# Patient Record
Sex: Female | Born: 1980 | Race: White | Hispanic: No | Marital: Single | Smoking: Former smoker
Health system: Southern US, Community
[De-identification: ages and names within clinical notes are randomized; demographics above are authoritative.]

## PROBLEM LIST (undated history)

## (undated) DIAGNOSIS — F419 Anxiety disorder, unspecified: Secondary | ICD-10-CM

## (undated) HISTORY — PX: ANKLE SURGERY: SHX546

---
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
## 2004-02-22 ENCOUNTER — Emergency Department: Payer: Self-pay | Admitting: Emergency Medicine

## 2004-11-26 ENCOUNTER — Emergency Department: Payer: Self-pay | Admitting: General Practice

## 2004-11-27 ENCOUNTER — Ambulatory Visit: Payer: Self-pay | Admitting: General Practice

## 2004-11-29 ENCOUNTER — Ambulatory Visit: Payer: Self-pay | Admitting: Obstetrics and Gynecology

## 2005-06-01 ENCOUNTER — Emergency Department: Payer: Self-pay | Admitting: Emergency Medicine

## 2007-04-16 IMAGING — US US OB US >=[ID] SNGL FETUS
1 series · 14 of 28 positions shown · non-contrast
Comparison: none

REASON FOR EXAM: Positive Bleeding Beta 96111
COMMENTS:

[Series 1: us ob us >=(id) sngl fetus · 0.39mm/px · 14 of 39 slices shown]
[im 2/39]
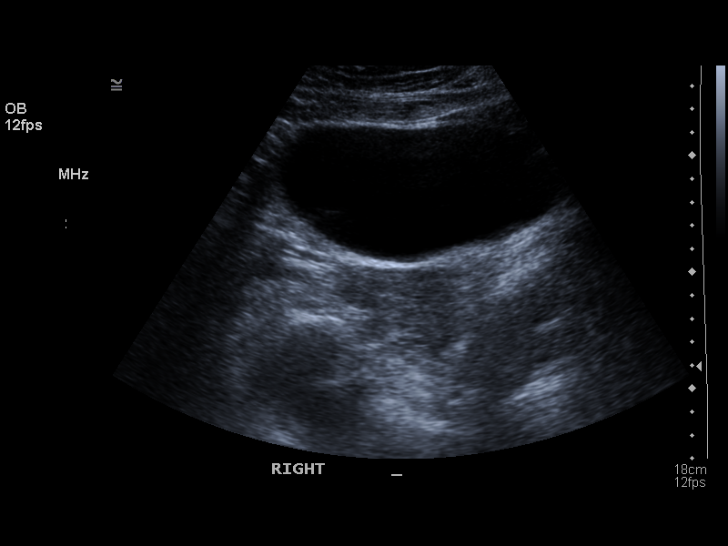
[im 5/39]
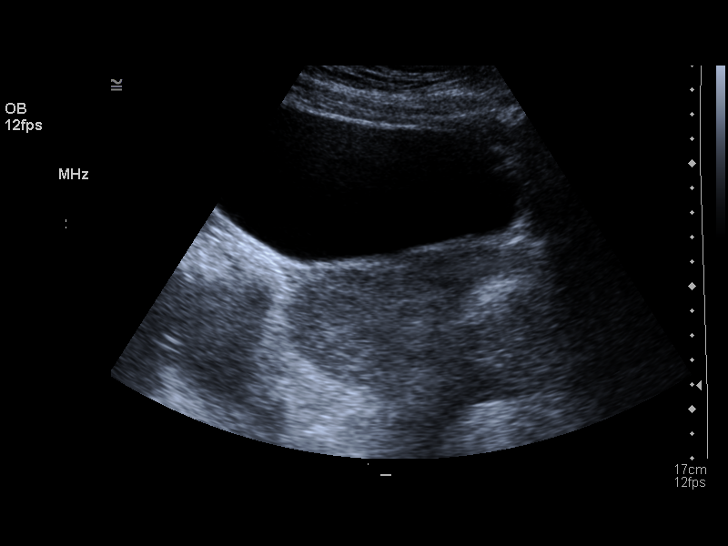
[im 8/39]
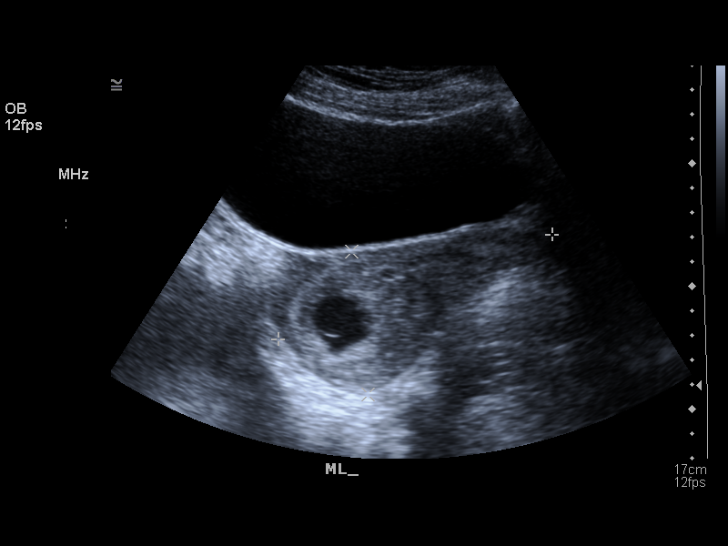
[im 10/39]
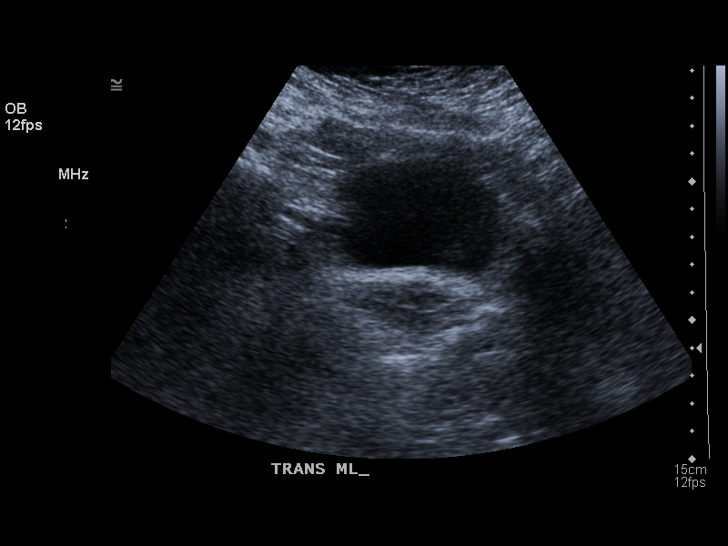
[im 13/39]
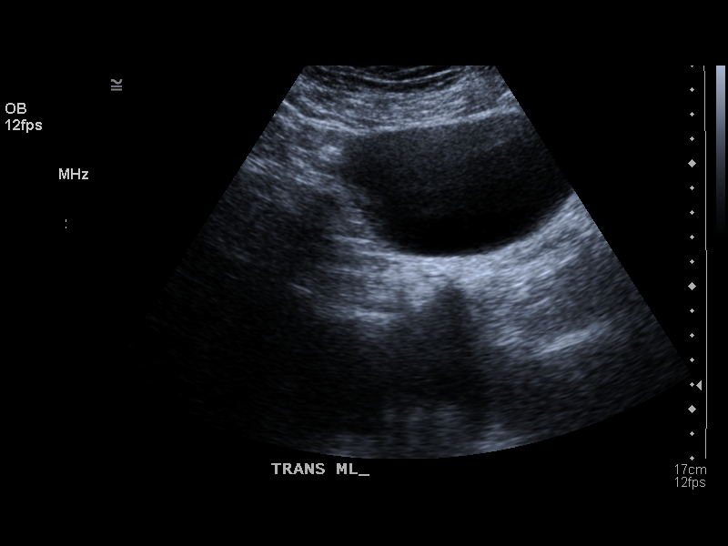
[im 16/39]
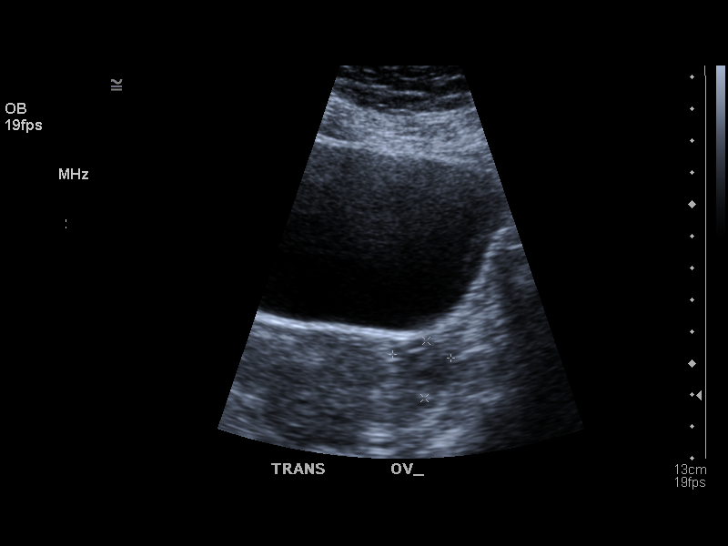
[im 19/39]
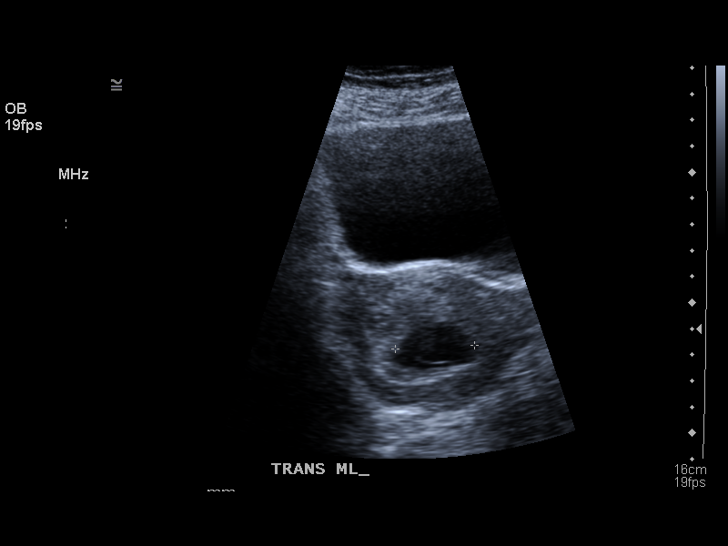
[im 22/39]
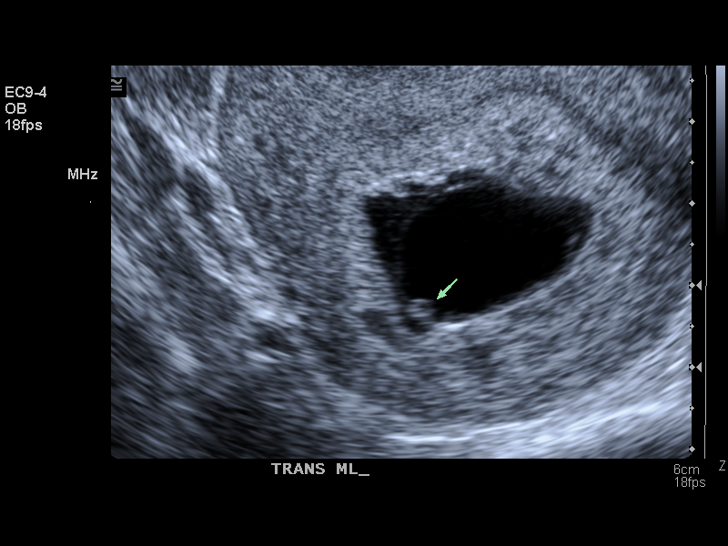
[im 24/39]
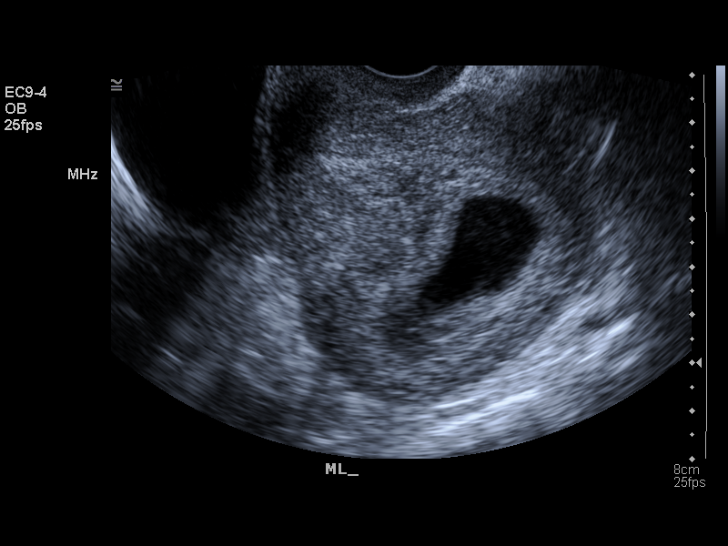
[im 27/39]
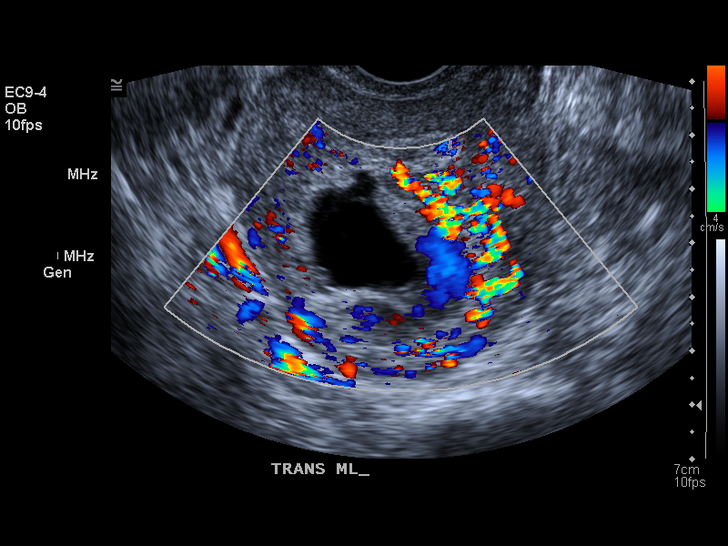
[im 30/39]
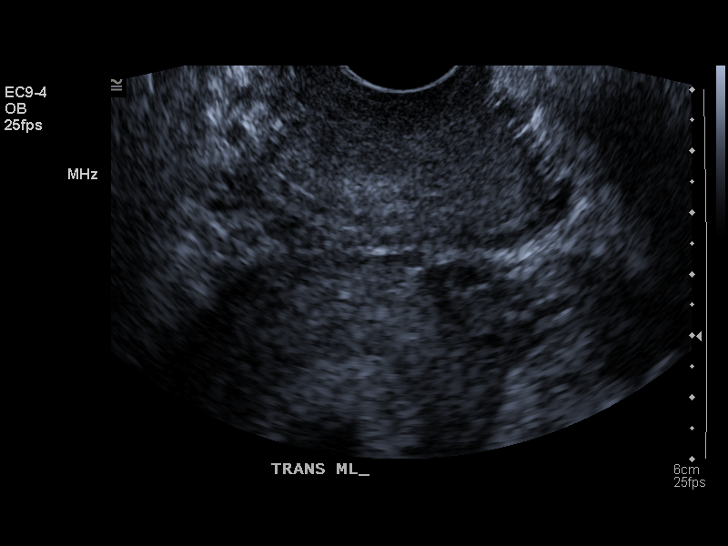
[im 33/39]
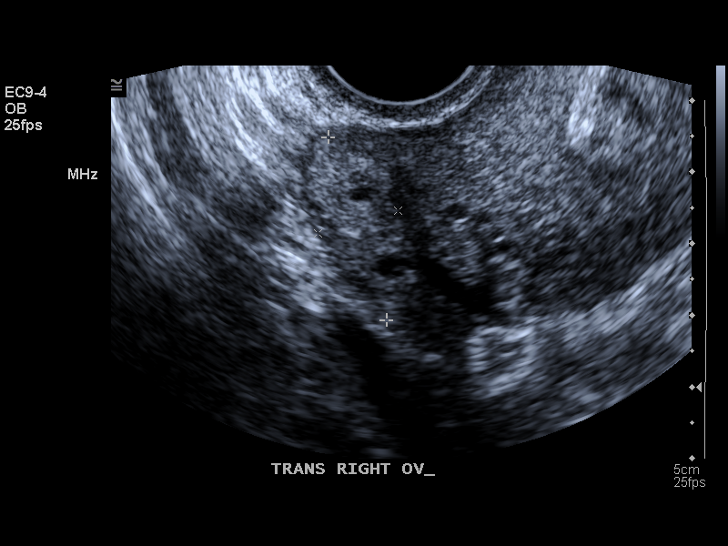
[im 36/39]
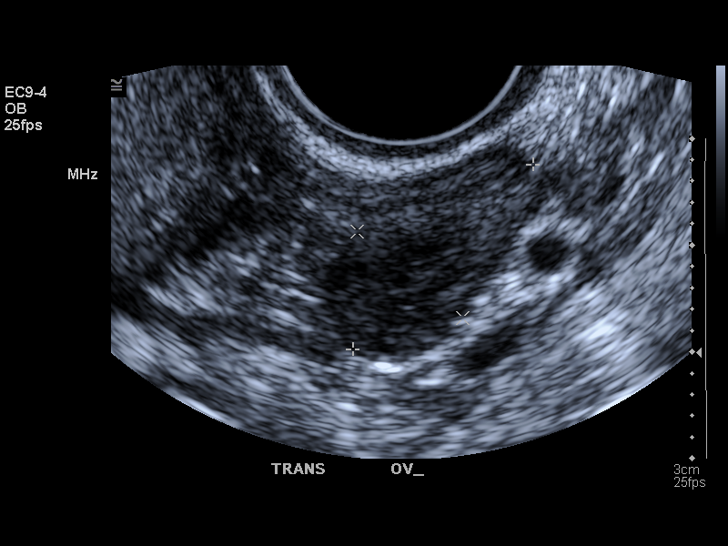
[im 39/39]
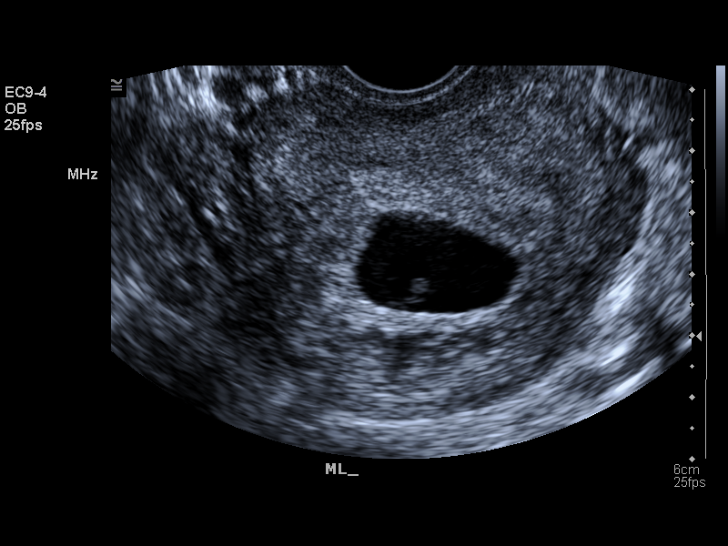

[14 of 28 positions shown; findings below may reference images not displayed]

PROCEDURE:     US  - US PREGNANCY / FETAL AGE  - [birth year] 2004 [DATE]

RESULT:       There is observed an intrauterine gestational sac.  Within the
sac, a fetal pole is visualized.  The crown-rump length measures 4.1 mm
which corresponds to an estimated gestational age of 6 weeks 1 day.  No
fetal cardiac activity is seen which would be consistent with fetal demise.
The gestational sac is also somewhat irregular.   No abnormal adnexal masses
are seen.  There is no free fluid observed in the pelvis.  The ovaries are
normal in appearance bilaterally.
IMPRESSION: An intrauterine gestation is observed.  No fetal cardiac activity is seen
which is compatible with fetal demise.

## 2009-04-28 ENCOUNTER — Ambulatory Visit: Payer: Self-pay

## 2013-07-30 ENCOUNTER — Emergency Department: Payer: Self-pay | Admitting: Emergency Medicine

## 2015-02-03 DIAGNOSIS — E669 Obesity, unspecified: Secondary | ICD-10-CM | POA: Insufficient documentation

## 2015-10-25 ENCOUNTER — Encounter: Payer: Self-pay | Admitting: Emergency Medicine

## 2015-10-25 ENCOUNTER — Emergency Department
Admission: EM | Admit: 2015-10-25 | Discharge: 2015-10-26 | Payer: BLUE CROSS/BLUE SHIELD | Attending: Emergency Medicine | Admitting: Emergency Medicine

## 2015-10-25 DIAGNOSIS — F32A Depression, unspecified: Secondary | ICD-10-CM

## 2015-10-25 DIAGNOSIS — Z046 Encounter for general psychiatric examination, requested by authority: Secondary | ICD-10-CM

## 2015-10-25 DIAGNOSIS — T404X2A Poisoning by other synthetic narcotics, intentional self-harm, initial encounter: Secondary | ICD-10-CM | POA: Insufficient documentation

## 2015-10-25 DIAGNOSIS — T50902A Poisoning by unspecified drugs, medicaments and biological substances, intentional self-harm, initial encounter: Secondary | ICD-10-CM

## 2015-10-25 DIAGNOSIS — F17210 Nicotine dependence, cigarettes, uncomplicated: Secondary | ICD-10-CM | POA: Insufficient documentation

## 2015-10-25 DIAGNOSIS — R45851 Suicidal ideations: Secondary | ICD-10-CM

## 2015-10-25 DIAGNOSIS — F332 Major depressive disorder, recurrent severe without psychotic features: Secondary | ICD-10-CM

## 2015-10-25 DIAGNOSIS — F1010 Alcohol abuse, uncomplicated: Secondary | ICD-10-CM

## 2015-10-25 DIAGNOSIS — F10120 Alcohol abuse with intoxication, uncomplicated: Secondary | ICD-10-CM | POA: Insufficient documentation

## 2015-10-25 DIAGNOSIS — F329 Major depressive disorder, single episode, unspecified: Secondary | ICD-10-CM | POA: Insufficient documentation

## 2015-10-25 DIAGNOSIS — F10920 Alcohol use, unspecified with intoxication, uncomplicated: Secondary | ICD-10-CM

## 2015-10-25 DIAGNOSIS — T50901A Poisoning by unspecified drugs, medicaments and biological substances, accidental (unintentional), initial encounter: Secondary | ICD-10-CM

## 2015-10-25 HISTORY — DX: Anxiety disorder, unspecified: F41.9

## 2015-10-25 NOTE — ED Triage Notes (Signed)
EMS pt from home after taking 6 Tramadol and drinking 6 shots of liquor in the one hour pta; friend called 911; pt tearful; denies suicidal attempt, says "I just wanted to be left alone"; pt says people she thought were friends are spreading lies about her;

## 2015-10-26 ENCOUNTER — Inpatient Hospital Stay
Admit: 2015-10-26 | Discharge: 2015-10-28 | DRG: 885 | Disposition: A | Discharge status: Home / Self Care | Payer: BLUE CROSS/BLUE SHIELD | Source: Ambulatory Visit | Attending: Psychiatry | Admitting: Psychiatry

## 2015-10-26 DIAGNOSIS — Z915 Personal history of self-harm: Secondary | ICD-10-CM | POA: Diagnosis not present

## 2015-10-26 DIAGNOSIS — Z046 Encounter for general psychiatric examination, requested by authority: Secondary | ICD-10-CM | POA: Diagnosis present

## 2015-10-26 DIAGNOSIS — Z818 Family history of other mental and behavioral disorders: Secondary | ICD-10-CM | POA: Diagnosis not present

## 2015-10-26 DIAGNOSIS — R45851 Suicidal ideations: Secondary | ICD-10-CM | POA: Diagnosis present

## 2015-10-26 DIAGNOSIS — F332 Major depressive disorder, recurrent severe without psychotic features: Secondary | ICD-10-CM

## 2015-10-26 DIAGNOSIS — F4310 Post-traumatic stress disorder, unspecified: Secondary | ICD-10-CM | POA: Diagnosis present

## 2015-10-26 DIAGNOSIS — T50901A Poisoning by unspecified drugs, medicaments and biological substances, accidental (unintentional), initial encounter: Secondary | ICD-10-CM | POA: Diagnosis present

## 2015-10-26 DIAGNOSIS — F1010 Alcohol abuse, uncomplicated: Secondary | ICD-10-CM

## 2015-10-26 DIAGNOSIS — F17210 Nicotine dependence, cigarettes, uncomplicated: Secondary | ICD-10-CM | POA: Diagnosis present

## 2015-10-26 DIAGNOSIS — F17200 Nicotine dependence, unspecified, uncomplicated: Secondary | ICD-10-CM | POA: Diagnosis present

## 2015-10-26 LAB — CBC WITH DIFFERENTIAL/PLATELET
Basophils Absolute: 0 10*3/uL (ref 0–0.1)
Basophils Relative: 0 %
Eosinophils Absolute: 0.1 10*3/uL (ref 0–0.7)
Eosinophils Relative: 1 %
HEMATOCRIT: 41.7 % (ref 35.0–47.0)
Hemoglobin: 14.8 g/dL (ref 12.0–16.0)
LYMPHS ABS: 2.5 10*3/uL (ref 1.0–3.6)
MCH: 31.1 pg (ref 26.0–34.0)
MCHC: 35.5 g/dL (ref 32.0–36.0)
MCV: 87.5 fL (ref 80.0–100.0)
MONO ABS: 0.6 10*3/uL (ref 0.2–0.9)
NEUTROS ABS: 5.6 10*3/uL (ref 1.4–6.5)
Neutrophils Relative %: 63 %
Platelets: 269 10*3/uL (ref 150–440)
RBC: 4.76 MIL/uL (ref 3.80–5.20)
RDW: 13.1 % (ref 11.5–14.5)
WBC: 8.9 10*3/uL (ref 3.6–11.0)

## 2015-10-26 LAB — COMPREHENSIVE METABOLIC PANEL
ALT: 18 U/L (ref 14–54)
ANION GAP: 4 — AB (ref 5–15)
AST: 21 U/L (ref 15–41)
Albumin: 4.6 g/dL (ref 3.5–5.0)
Alkaline Phosphatase: 63 U/L (ref 38–126)
BILIRUBIN TOTAL: 0.4 mg/dL (ref 0.3–1.2)
BUN: 13 mg/dL (ref 6–20)
CALCIUM: 9.4 mg/dL (ref 8.9–10.3)
CO2: 31 mmol/L (ref 22–32)
Chloride: 109 mmol/L (ref 101–111)
Creatinine, Ser: 0.64 mg/dL (ref 0.44–1.00)
GFR calc Af Amer: >60 mL/min (ref >60)
Glucose, Bld: 76 mg/dL (ref 65–99)
POTASSIUM: 3 mmol/L — AB (ref 3.5–5.1)
Sodium: 144 mmol/L (ref 135–145)
TOTAL PROTEIN: 7.7 g/dL (ref 6.5–8.1)

## 2015-10-26 LAB — SALICYLATE LEVEL

## 2015-10-26 LAB — ACETAMINOPHEN LEVEL

## 2015-10-26 LAB — ETHANOL: Alcohol, Ethyl (B): 170 mg/dL — ABNORMAL HIGH (ref <5)

## 2015-10-26 LAB — POCT PREGNANCY, URINE: Preg Test, Ur: NEGATIVE

## 2015-10-26 MED ORDER — NICOTINE 21 MG/24HR TD PT24
21.0000 mg | MEDICATED_PATCH | Freq: Every day | TRANSDERMAL | Status: DC
  Filled 2015-10-26: qty 1

## 2015-10-26 MED ORDER — ALUM & MAG HYDROXIDE-SIMETH 200-200-20 MG/5ML PO SUSP: 30.0000 mL | ORAL | Status: DC | PRN

## 2015-10-26 MED ORDER — ACETAMINOPHEN 325 MG PO TABS
650.0000 mg | ORAL_TABLET | Freq: Four times a day (QID) | ORAL | Status: DC | PRN
  Administered 2015-10-27 – 2015-10-28 (×2): 650 mg via ORAL
  Filled 2015-10-26 (×2): qty 2

## 2015-10-26 MED ORDER — MAGNESIUM HYDROXIDE 400 MG/5ML PO SUSP: 30.0000 mL | Freq: Every day | ORAL | Status: DC | PRN

## 2015-10-26 MED ORDER — TRAZODONE HCL 100 MG PO TABS
100.0000 mg | ORAL_TABLET | Freq: Every day | ORAL | Status: DC
  Administered 2015-10-27: 100 mg via ORAL
  Filled 2015-10-26: qty 1

## 2015-10-26 MED ORDER — ESCITALOPRAM OXALATE 10 MG PO TABS
10.0000 mg | ORAL_TABLET | Freq: Every day | ORAL | Status: DC
  Administered 2015-10-27: 10 mg via ORAL
  Filled 2015-10-26: qty 1

## 2015-10-26 NOTE — Progress Notes (Signed)
Patient with angry affect, cooperative behavior with admit to unit. No SI/HI at this time. VS monitored and recorded. No discomfort at this time. Skin check with no wounds or bruises. Skin check with no contraband found.

## 2015-10-26 NOTE — ED Provider Notes (Signed)
[facility] Emergency Department Provider Note  ____________________________________________   First MD Initiated Contact with Patient 10/25/15 2350     (approximate)  I have reviewed the triage vital signs and the nursing notes.   HISTORY  Chief Complaint Ingestion    HPI Maria Sandoval is a 35 y.o. female with a history of depression who presents by EMS for evaluation of intentional ingestion/overdose.  She is currently alert and oriented in spite of her ingestion.  She reports having depression for many years and a prior suicide attempt by slashing her wrists many years ago.  Her symptoms improved for awhile, but have been gradually worsening over the last three weeks in spite of being treated with a new medication by her outpatient psychiatrist.  She reports many issues with social stressors, problems with friends and family, feelings of persecution by loved ones, etc.  she states that it all got bad enough today that she decided to take more than her usual tramadol and 6 shots of whiskey in an attempt to "make it all go away".  When she was describing that she was tearful would not make eye contact with me.  Initially she would not answer when I ask about whether she was having thoughts of dying or killing herself and then eventually she admitted that she is.  She denies hallucinations and homicidal ideation.  She also denies fever/chills, chest pain, shortness of breath, nausea, vomiting, diarrhea, dysuria.  She states that her symptoms are severe and nothing is making it better and it is getting worse over time.   Past Medical History:  Diagnosis Date  . Anxiety disorder     There are no active problems to display for this patient.   Past Surgical History:  Procedure Laterality Date  . ANKLE SURGERY Right     Prior to Admission medications   Medication Sig Start Date End Date Taking? Authorizing Provider  LORazepam (ATIVAN) 0.5 MG tablet Take  0.5 mg by mouth at bedtime.   Yes Historical Provider, MD  traMADol (ULTRAM) 50 MG tablet Take by mouth every 6 (six) hours as needed.   Yes Historical Provider, MD    Allergies Review of patient's allergies indicates no known allergies.  History reviewed. No pertinent family history.  Social History Social History  Substance Use Topics  . Smoking status: Current Every Day Smoker    Types: Cigarettes  . Smokeless tobacco: Never Used  . Alcohol use Yes    Review of Systems Constitutional: No fever/chills Eyes: No visual changes. ENT: No sore throat. Cardiovascular: Denies chest pain. Respiratory: Denies shortness of breath. Gastrointestinal: No abdominal pain.  No nausea, no vomiting.  No diarrhea.  No constipation. Genitourinary: Negative for dysuria. Musculoskeletal: Negative for back pain. Skin: Negative for rash. Neurological: Negative for headaches, focal weakness or numbness. Psych:  Worsening depression with SI and intentional overdose of pain medication and ETOH  10-point ROS otherwise negative.  ____________________________________________   PHYSICAL EXAM:  VITAL SIGNS: ED Triage Vitals  Enc Vitals Group     BP 10/25/15 2205 139/88     Pulse Rate 10/25/15 2205 91     Resp 10/25/15 2205 18     Temp 10/25/15 2205 98.7 F (37.1 C)     Temp src --      SpO2 10/25/15 2205 99 %     Weight 10/25/15 2205 220 lb (99.8 kg)     Height 10/25/15 2205  (1.702 m)  Head Circumference --      Peak Flow --      Pain Score 10/25/15 2206 0     Pain Loc --      Pain Edu? --      Excl. in GC? --     Constitutional: Alert and oriented. Well appearing and in no acute distress. Eyes: Conjunctivae are normal. PERRL. EOMI. Head: Atraumatic. Nose: No congestion/rhinnorhea. Mouth/Throat: Mucous membranes are moist.  Oropharynx non-erythematous. Neck: No stridor.  No meningeal signs.   Cardiovascular: Normal rate, regular rhythm. Good peripheral circulation. Grossly  normal heart sounds.   Respiratory: Normal respiratory effort.  No retractions. Lungs CTAB. Gastrointestinal: Soft and nontender. No distention.  Musculoskeletal: No lower extremity tenderness nor edema. No gross deformities of extremities. Neurologic:  Normal speech and language. No gross focal neurologic deficits are appreciated.  Skin:  Skin is warm, dry and intact. No rash noted. Psychiatric: Mood and affect are flat and depressed with SI. Speech and behavior are normal.  ____________________________________________   LABS (all labs ordered are listed, but only abnormal results are displayed)  Labs Reviewed - No data to display ____________________________________________  EKG  None ____________________________________________  RADIOLOGY   No results found.  ____________________________________________   PROCEDURES  Procedure(s) performed:   Procedures   Critical Care performed: No ____________________________________________   INITIAL IMPRESSION / ASSESSMENT AND PLAN / ED COURSE  Pertinent labs & imaging results that were available during my care of the patient were reviewed by me and considered in my medical decision making (see chart for details).  Believe the patient's worsening depression in spite of outpatient treatment with an intentional overdose today and positive suicidal ideation indicate the need for involuntary commitment and psychiatric consult.  She has no acute medical issues at this time.  Her ethanol level was elevated but she is alert, oriented, speaking clearly, and ambulatory without difficulty.  We will move her over to the behavioral health unit awaiting psychiatric consultation.  I reviewed her lab work which was provided by paper report due to computer downtime.  There were no significant lab abnormalities or indication of acute medical illness   ____________________________________________  FINAL CLINICAL IMPRESSION(S) / ED  DIAGNOSES  Final diagnoses:  Depression  Suicidal ideation  Alcohol intoxication, uncomplicated (HCC)  OD (overdose of drug), intentional self-harm, initial encounter (HCC)     MEDICATIONS GIVEN DURING THIS VISIT:  Medications - No data to display   NEW OUTPATIENT MEDICATIONS STARTED DURING THIS VISIT:  New Prescriptions   No medications on file      Note:  This document was prepared using Dragon voice recognition software and may include unintentional dictation errors.    Loleta Roseory Aliyanna Wassmer, MD 10/26/15 [phone]

## 2015-10-26 NOTE — ED Notes (Signed)
Downtime documentation from 2345 on 10/25/2015 [facility]

## 2015-10-26 NOTE — ED Notes (Signed)
Patient awake, alert, and oriented. She denies SI. Patient has been cooperative with nursing interventions.  Patient declined shower. She is waiting to see psychiatrist and is hoping for discharge.

## 2015-10-26 NOTE — Progress Notes (Signed)
[facility] by Dr. Toni Amendlapacs.  Attending Physician will be Dr. Jennet MaduroPucilowska.   Patient [facility] room 304[facility].   Intake Paper Work has been signed and placed on patient chart.  ER staff is aware of the admission Carlisle Beers( [facility].;  ER MD; Amy Patient's Nurse & Dorothy Patient Access).   10/26/2015 Cheryl FlashNicole Josemaria Brining, MS, NCC, LPCA Therapeutic Triage Specialist

## 2015-10-26 NOTE — ED Notes (Signed)
Report given to floor nurse, Victorino DikeJennifer.

## 2015-10-26 NOTE — ED Notes (Signed)
Sitter at bedside to remain until further notice; pt tearful; vague with answers; when asked about suicidal thoughts pt paused and then shook head quickly stating no in weak voice;

## 2015-10-26 NOTE — ED Notes (Signed)
Patient resting in room, watching television. No complaints. Maintained on 15 minute checks and observation by security camera for safety.

## 2015-10-26 NOTE — ED Notes (Signed)
Patient resting quietly in room. No noted distress or abnormal behaviors noted. Will continue 15 minute checks and observation by security camera for safety. 

## 2015-10-26 NOTE — ED Notes (Signed)
[facility].

## 2015-10-26 NOTE — ED Provider Notes (Signed)
-----------------------------------------   7:28 AM on 10/26/2015 -----------------------------------------   Blood pressure 135/82, pulse (!) 101, temperature 98.6 F (37 C), temperature source Oral, resp. rate 18, height 5\' 7"  (1.702 m), weight 220 lb (99.8 kg), last menstrual period 10/25/2015, SpO2 100 %.  The patient had no acute events since last update.  Calm and cooperative at this time.  Disposition is pending per Psychiatry/Behavioral Medicine team recommendations.     Jennye MoccasinBrian S Quigley, MD 10/26/15 [phone]

## 2015-10-26 NOTE — Progress Notes (Signed)
Pt has requested that writer contact her job as she was schedule to work on today and is extremely anxious. Pt states that she is unconformable with making the call at this time but is concerned about maintaining employment. Larita FifeLynn (ED social worker at bed side ) as pt has given Manufacturing systems engineerwriter verbal consent  to contact her place of employment Photographer( [employer]) and provide an update.  As requested writer has contacted the pts supervisor Peyton Najjar(Larry). TTS has informed pts place of employment that the pt has presented to the ER and is unable to contact him at this time.      10/26/2015 Cheryl FlashNicole Tobe Kervin, MS, NCC, LPCA Therapeutic Triage Specialist
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
## 2015-10-26 NOTE — ED Notes (Signed)
Patient was given a meal tray. She is currently meeting with TTS for an assessment.Maintained on 15 minute checks and observation by security camera for safety.

## 2015-10-26 NOTE — ED Notes (Signed)
Patient asleep in room. No noted distress or abnormal behavior. Will continue 15 minute checks and observation by security cameras for safety. 

## 2015-10-26 NOTE — ED Notes (Signed)

## 2015-10-26 NOTE — Tx Team (Signed)
Initial Interdisciplinary Treatment Plan   PATIENT STRESSORS: Health problems Substance abuse   PATIENT STRENGTHS: Communication skills Physical Health   PROBLEM LIST: Problem List/Patient Goals Date to be addressed Date deferred Reason deferred Estimated date of resolution  Suicidal ideas  8/9           Substance abuse  8/9                                          DISCHARGE CRITERIA:  Ability to meet basic life and health needs Adequate post-discharge living arrangements Improved stabilization in mood, thinking, and/or behavior  PRELIMINARY DISCHARGE PLAN: Outpatient therapy Return to previous living arrangement  PATIENT/FAMIILY INVOLVEMENT: This treatment plan has been presented to and reviewed with the patient, Maria Sandoval, and/or family member, .  The patient and family have been given the opportunity to ask questions and make suggestions.  Ignacia FellingJennifer A Penina Reisner 10/26/2015, 6:15 PM

## 2015-10-26 NOTE — ED Notes (Signed)
Pt. To BHU from ED ambulatory without difficulty, to room BHU 7 . Report from 3M CompanyLaura RN. Pt. Is alert and oriented, warm and dry in no distress. Pt. Denies SI, HI, and AVH. Pt. Calm and cooperative. Pt states, "I just want to go home, I am going to lose my job they don't care that I am in the hospital." This writer explained to pt that as of right now she has to stay until she speaks to psych. Pt. Made aware of security cameras and Q15 minute rounds. Pt. Encouraged to let Nursing staff know of any concerns or needs.

## 2015-10-26 NOTE — ED Notes (Signed)
Patient meeting with psychiatrist at present.

## 2015-10-26 NOTE — ED Notes (Signed)
RN attempted to talk to patient about impending admission. Patient refused to talk, saying the doctor "has ruined her life, she will lose her job and her house if she is admitted." Patient was encouraged to make any necessary phone calls but she walked away from nurse.

## 2015-10-26 NOTE — ED Notes (Signed)
ED BHU PLACEMENT JUSTIFICATION Is the patient under IVC or is there intent for IVC: Yes.   Is the patient medically cleared: Yes.   Is there vacancy in the ED BHU: Yes.   Is the population mix appropriate for patient: Yes.   Is the patient awaiting placement in inpatient or outpatient setting: No. Has the patient had a psychiatric consult: No. Survey of unit performed for contraband, proper placement and condition of furniture, tampering with fixtures in bathroom, shower, and each patient room: Yes.  ; Findings: NA APPEARANCE/BEHAVIOR calm, cooperative and adequate rapport can be established NEURO ASSESSMENT Orientation: time, place and person Hallucinations: No.None noted (Hallucinations) Speech: Normal Gait: normal RESPIRATORY ASSESSMENT Normal expansion.  Clear to auscultation.  No rales, rhonchi, or wheezing. CARDIOVASCULAR ASSESSMENT regular rate and rhythm, S1, S2 normal, no murmur, click, rub or gallop GASTROINTESTINAL ASSESSMENT soft, nontender, BS WNL, no r/g EXTREMITIES normal strength, tone, and muscle mass PLAN OF CARE Provide calm/safe environment. Vital signs assessed twice daily. ED BHU Assessment once each 12-hour shift. Collaborate with intake RN daily or as condition indicates. Assure the ED provider has rounded once each shift. Provide and encourage hygiene. Provide redirection as needed. Assess for escalating behavior; address immediately and inform ED provider.  Assess family dynamic and appropriateness for visitation as needed: Yes.  ; If necessary, describe findings: NA Educate the patient/family about BHU procedures/visitation: Yes.  ; If necessary, describe findings: NA  

## 2015-10-26 NOTE — Progress Notes (Signed)
Pt being reviewed for possible admission to [facility].  Pts assessment has been faxed to the BHU for the charge nurse to review and provide bed assignment.      Nicole Mineola Duan, MS, NCC, LPCA Therapeutic Triage Specialist    

## 2015-10-26 NOTE — ED Notes (Signed)
Attempted to call report to floor. Nurse not available. Message left to return call.
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
## 2015-10-26 NOTE — ED Notes (Signed)
Patient transferred to [facility] unit for continued treatment. Cooperative with transfer. All personal belongings sent with patient.

## 2015-10-26 NOTE — ED Notes (Signed)

## 2015-10-26 NOTE — ED Notes (Signed)
Patient unhappy that physician is wanting to admit her to inpatient unit for treatment. Patient wants to go home to take care of pet and says if "her pet dies, it will be the doctor's fault." Patient has been minimizing her depression; states she is not suicidal.

## 2015-10-26 NOTE — ED Notes (Signed)
Pt wanting to leave; understands needs to be seen by MD; calm and cooperative at this time

## 2015-10-27 DIAGNOSIS — F332 Major depressive disorder, recurrent severe without psychotic features: Principal | ICD-10-CM

## 2015-10-27 LAB — LIPID PANEL
Cholesterol: 190 mg/dL (ref 0–200)
HDL: 57 mg/dL (ref >40)
LDL CALC: 114 mg/dL — AB (ref 0–99)
TRIGLYCERIDES: 93 mg/dL (ref <150)
Total CHOL/HDL Ratio: 3.3 RATIO
VLDL: 19 mg/dL (ref 0–40)

## 2015-10-27 LAB — TSH: TSH: 0.933 u[IU]/mL (ref 0.350–4.500)

## 2015-10-27 LAB — HEMOGLOBIN A1C: Hgb A1c MFr Bld: 5.1 % (ref 4.0–6.0)

## 2015-10-27 MED ORDER — PRAZOSIN HCL 2 MG PO CAPS
2.0000 mg | ORAL_CAPSULE | Freq: Two times a day (BID) | ORAL | Status: DC
  Administered 2015-10-27 – 2015-10-28 (×3): 2 mg via ORAL
  Filled 2015-10-27 (×3): qty 1

## 2015-10-27 MED ORDER — ARIPIPRAZOLE 2 MG PO TABS
2.0000 mg | ORAL_TABLET | Freq: Every day | ORAL | Status: DC
  Administered 2015-10-27: 2 mg via ORAL
  Filled 2015-10-27 (×2): qty 1

## 2015-10-27 MED ORDER — TRAMADOL HCL 50 MG PO TABS
50.0000 mg | ORAL_TABLET | Freq: Four times a day (QID) | ORAL | Status: DC | PRN
  Administered 2015-10-27 (×3): 50 mg via ORAL
  Filled 2015-10-27 (×3): qty 1

## 2015-10-27 MED ORDER — FLUVOXAMINE MALEATE 50 MG PO TABS
50.0000 mg | ORAL_TABLET | Freq: Every day | ORAL | Status: DC
  Administered 2015-10-27: 50 mg via ORAL
  Filled 2015-10-27: qty 1

## 2015-10-27 NOTE — Progress Notes (Signed)
Recreation Therapy Notes  INPATIENT RECREATION THERAPY ASSESSMENT  Patient Details Name: Maria Sandoval MRN: [id number] DOB: 05/23/1980 Today's Date: 10/27/2015  Patient Stressors: Family, Relationship, Other (Comment) (Doesn't get along with mom; recent break-up; every day things)  Coping Skills:   Exercise, Art/Dance, Talking, Music, Other (Comment) (Relax, play with her cats)  Personal Challenges: Communication, Concentration, Decision-Making, Relationships, Self-Esteem/Confidence, Stress Management, Time Management, Trusting Others  Leisure Interests (2+):  Individual - Other (Comment) (Photography, animals)  Awareness of Community Resources:   ("You can always look stuff up online")  Community Resources:     Current Use:    If no, Barriers?:    Patient Strengths:  "I don't know"  Patient Identified Areas of Improvement:  Getting out of here  Current Recreation Participation:  Photography, meditation books, cats, movies, music, hanging out with friends  Patient Goal for Hospitalization:  To get out and get home to cats and not lose her job  [place]  [place] Residence:  [place]   Current SI (including self-harm):  No  Current HI:  No  Consent to Intern Participation: N/A   [name],[name], LRT/CTRS 10/27/2015, 4:04 PM

## 2015-10-27 NOTE — Progress Notes (Signed)
D: Observed pt in the room sitting on bed. Patient alert and oriented x4. Patient denies SI/HI/AVH. Pt affect is irritable, depressed and anxious. Pt demanding during evening. Pt forwarded little, interacted minimally and went to sleep early. Pt woke up early in the morning, and forwarded a little more to Clinical research associatewriter. Pt continued to indicate that overdose was not suicide "just had a rough week, that's all." Pt asking for Ultram A: Offered active listening and support. Provided therapeutic communication. Administered scheduled medications. Encouraged pt to attend group. Educated pt on unit policy and oriented pt to unit. Advised pt to talk to doctor about ultram prescription. R: Pt pleasant and cooperative. Pt medication compliant. Will continue Q15 min. checks. Safety maintained.

## 2015-10-27 NOTE — BHH Group Notes (Signed)
BHH Group Notes:  (Nursing/MHT/Case Management/Adjunct)  Date:  10/27/2015  Time:  3:52 AM  Type of Therapy:  Group Therapy  Participation Level:  Did Not Attend   Veva Holesshley Imani Linde Wilensky 10/27/2015, 3:52 AM
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
## 2015-10-27 NOTE — Plan of Care (Signed)
Problem: Self-Concept: Goal: Ability to disclose and discuss suicidal ideas will improve Outcome: Not Progressing Pt denied that overdose was suicide attempt, and denied any previous suicide attempts

## 2015-10-27 NOTE — Progress Notes (Signed)
Recreation Therapy Notes  Date: 08.10.17 Time: 1:15 pm  Location: Craft Room  Group Topic: Leisure Education  Goal Area(s) Addresses:  Patient will identify activities for each letter of the alphabet. Patient will verbalize ability to integrate positive leisure into life post d/c. Patient will verbalize ability to use leisure as a Associate Professorcoping skill.  Behavioral Response: Attentive, Interactive  Intervention: Leisure Alphabet  Activity: Patients were given a Leisure Information systems managerAlphabet worksheet and instructed to write healthy leisure activities for each letter of the alphabet.  Education: LRT educated patients on what they need to participate in leisure.  Education Outcome: Acknowledges education/In group clarification offered   Clinical Observations/Feedback: Patient completed activity by writing leisure activities. Patient contributed to group discussion by stating some leisure activities.  [name],Christophere [place]house M, LRT/CTRS 10/27/2015 3:42 PM

## 2015-10-27 NOTE — Progress Notes (Signed)
Patient alert and orient x4. Irritable mood this am. Denies SI, HI, AVH. Reports pain to ankle and requests tramadol for pain. Complains of headache. Request tylenol. Pt guarded, forwards little information. States she did not try to commit suicide. A: Encouragement and support offered. Medications administered as prescribed. Safety checks maintained.  R: Pt receptive and remains safe on unit with q 15 min checks.

## 2015-10-27 NOTE — H&P (Signed)
Psychiatric Admission Assessment Adult  Patient Identification: Maria Sandoval MRN:  [id number] Date of Evaluation:  10/27/2015 Chief Complaint:  severe Recurrent Major Depression Principal Diagnosis: Severe recurrent major depression without psychotic features (HCC) Diagnosis:   Patient Active Problem List   Diagnosis Date Noted  . Severe recurrent major depression without psychotic features (HCC) [F33.2] 10/26/2015    Priority: High  . Overdose [T50.901A] 10/26/2015  . Alcohol use disorder, mild, abuse [F10.10] 10/26/2015  . Involuntary commitment [Z04.6] 10/26/2015  . Tobacco use disorder [F17.200] 10/26/2015   History of Present Illness:   Identifying data. Maria Sandoval is a 35 year old female with a history of depression and anxiety.  Chief complaint. "I try to throw my boyfriend's attention."  History of present illness. Information was obtained from the patient and the chart. The patient has a long history of depression beginning at the age of 62. She has been tried on numerous medications for depression and bipolar depression with little improvement. She was a part of a [employer] study using Depakote. She has been in therapy multiple times but continuous do feel depressed and anxious. She broke up with her boyfriend and became even more depressed with poor sleep, decreased appetite, anhedonia, hopelessness worthlessness, poor energy and concentration, social isolation, and crying spells. On the night of admission she had several shots of vodka and took four tablets of tramadol that are prescribed for injured ankle. He thinks that her boyfriend called the police. She was admitted to psychiatry for stabilization. The patient goes back and forth saying that it was or wasn't suicide attempt. In any case she is done with her boyfriend and she realized that he doesn't care. She denies psychotic symptoms or symptoms suggestive of bipolar mania. She endorses many symptoms of anxiety with panic  attacks, social anxiety, and OCD. She also believes that she suffers PTSD at the age of 38 her parents forced her to have an abortion. 10 years ago she became pregnant again but had miscarriage while her sister at the same time delivered a healthy baby. The patient finds it difficult to be around her family and her niece. She denies excessive alcohol or illicit substance use.   Past psychiatric history. She denies prior hospitalizations or suicide attempts. She has been tried on all antidepressants that I can name except for the Remeron. She was also tried on Depakote, Tegretol, and Trileptal. She has never been on an antipsychotic.  Family psychiatric history. Mother with depression and anxiety, her aunt and her uncle had alcohol problems.  Social history. She lives independently with two cats. She works at [employer]. She just got out of not so good relationship.  Total Time spent with patient: 1 hour  Is the patient at risk to self? Yes.    Has the patient been a risk to self in the past 6 months? No.  Has the patient been a risk to self within the distant past? No.  Is the patient a risk to others? No.  Has the patient been a risk to others in the past 6 months? No.  Has the patient been a risk to others within the distant past? No.   Prior Inpatient Therapy:   Prior Outpatient Therapy:    Alcohol Screening: Patient refused Alcohol Screening Tool: Yes 1. How often do you have a drink containing alcohol?: Monthly or less 2. How many drinks containing alcohol do you have on a typical day when you are drinking?: 5 or 6 3. How often do  you have six or more drinks on one occasion?: Less than monthly Preliminary Score: 3 4. How often during the last year have you found that you were not able to stop drinking once you had started?: Never 5. How often during the last year have you failed to do what was normally expected from you becasue of drinking?: Never 6. How often during the last year  have you needed a first drink in the morning to get yourself going after a heavy drinking session?: Never 7. How often during the last year have you had a feeling of guilt of remorse after drinking?: Less than monthly 9. Have you or someone else been injured as a result of your drinking?: No 10. Has a relative or friend or a doctor or another health worker been concerned about your drinking or suggested you cut down?: No Alcohol Use Disorder Identification Test Final Score (AUDIT): 5 Brief Intervention: AUDIT score less than 7 or less-screening does not suggest unhealthy drinking-brief intervention not indicated Substance Abuse History in the last 12 months:  No. Consequences of Substance Abuse: NA Previous Psychotropic Medications: Yes  Psychological Evaluations: No  Past Medical History:  Past Medical History:  Diagnosis Date  . Anxiety disorder     Past Surgical History:  Procedure Laterality Date  . ANKLE SURGERY Right    Family History: History reviewed. No pertinent family history.  Tobacco Screening: Have you used any form of tobacco in the last 30 days? (Cigarettes, Smokeless Tobacco, Cigars, and/or Pipes): No Social History:  History  Alcohol Use  . Yes     History  Drug Use No    Additional Social History:                           Allergies:  No Known Allergies Lab Results:  Results for orders placed or performed during the hospital encounter of 10/25/15 (from the past 48 hour(s))  Acetaminophen level     Status: Abnormal   Collection Time: 10/25/15  9:54 PM  Result Value Ref Range   Acetaminophen (Tylenol), Serum <10 (L) 10 - 30 ug/mL    Comment:        THERAPEUTIC CONCENTRATIONS VARY SIGNIFICANTLY. A RANGE OF 10-30 ug/mL MAY BE AN EFFECTIVE CONCENTRATION FOR MANY PATIENTS. HOWEVER, SOME ARE BEST TREATED AT CONCENTRATIONS OUTSIDE THIS RANGE. ACETAMINOPHEN CONCENTRATIONS >150 ug/mL AT 4 HOURS AFTER INGESTION AND >50 ug/mL AT 12 HOURS AFTER  INGESTION ARE OFTEN ASSOCIATED WITH TOXIC REACTIONS.   Ethanol     Status: Abnormal   Collection Time: 10/25/15  9:54 PM  Result Value Ref Range   Alcohol, Ethyl (B) 170 (H) <5 mg/dL    Comment:        LOWEST DETECTABLE LIMIT FOR SERUM ALCOHOL IS 5 mg/dL FOR MEDICAL PURPOSES ONLY   CBC with Differential/Platelet     Status: None   Collection Time: 10/25/15  9:54 PM  Result Value Ref Range   WBC 8.9 3.6 - 11.0 K/uL   RBC 4.76 3.80 - 5.20 MIL/uL   Hemoglobin 14.8 12.0 - 16.0 g/dL   HCT 16.141.7 09.635.0 - 04.547.0 %   MCV 87.5 80.0 - 100.0 fL   MCH 31.1 26.0 - 34.0 pg   MCHC 35.5 32.0 - 36.0 g/dL   RDW 40.913.1 81.111.5 - 91.414.5 %   Platelets 269 150 - 440 K/uL   Neutrophils Relative % 63% %   Neutro Abs 5.6 1.4 - 6.5 K/uL  Lymphocytes Relative 29% %   Lymphs Abs 2.5 1.0 - 3.6 K/uL   Monocytes Relative 6% %   Monocytes Absolute 0.6 0.2 - 0.9 K/uL   Eosinophils Relative 1% %   Eosinophils Absolute 0.1 0 - 0.7 K/uL   Basophils Relative 0% %   Basophils Absolute 0.0 0 - 0.1 K/uL  Comprehensive metabolic panel     Status: Abnormal   Collection Time: 10/25/15  9:54 PM  Result Value Ref Range   Sodium 144 135 - 145 mmol/L   Potassium 3.0 (L) 3.5 - 5.1 mmol/L   Chloride 109 101 - 111 mmol/L   CO2 31 22 - 32 mmol/L   Glucose, Bld 76 65 - 99 mg/dL   BUN 13 6 - 20 mg/dL   Creatinine, Ser 9.60 0.44 - 1.00 mg/dL   Calcium 9.4 8.9 - 45.4 mg/dL   Total Protein 7.7 6.5 - 8.1 g/dL   Albumin 4.6 3.5 - 5.0 g/dL   AST 21 15 - 41 U/L   ALT 18 14 - 54 U/L   Alkaline Phosphatase 63 38 - 126 U/L   Total Bilirubin 0.4 0.3 - 1.2 mg/dL   GFR calc non Af Amer >60 >60 mL/min   GFR calc Af Amer >60 >60 mL/min   Anion gap 4 (L) 5 - 15  Salicylate level     Status: None   Collection Time: 10/25/15  9:54 PM  Result Value Ref Range   Salicylate Lvl <4.0 2.8 - 30.0 mg/dL  Pregnancy, urine POC     Status: None   Collection Time: 10/25/15 10:53 PM  Result Value Ref Range   Preg Test, Ur NEGATIVE NEGATIVE     Comment:        THE SENSITIVITY OF THIS METHODOLOGY IS >24 mIU/mL     Blood Alcohol level:  Lab Results  Component Value Date   ETH 170 (H) 10/25/2015    Metabolic Disorder Labs:  No results found for: HGBA1C, MPG No results found for: PROLACTIN No results found for: CHOL, TRIG, HDL, CHOLHDL, VLDL, LDLCALC  Current Medications: Current Facility-Administered Medications  Medication Dose Route Frequency Provider Last Rate Last Dose  . acetaminophen (TYLENOL) tablet 650 mg  650 mg Oral Q6H PRN Audery Amel, MD      . alum & mag hydroxide-simeth (MAALOX/MYLANTA) 200-200-20 MG/5ML suspension 30 mL  30 mL Oral Q4H PRN Audery Amel, MD      . ARIPiprazole (ABILIFY) tablet 2 mg  2 mg Oral Daily Makella Buckingham B Edenilson Austad, MD      . escitalopram (LEXAPRO) tablet 10 mg  10 mg Oral Daily John T Clapacs, MD      . fluvoxaMINE (LUVOX) tablet 50 mg  50 mg Oral QHS Braeden Dolinski B Braun Rocca, MD      . magnesium hydroxide (MILK OF MAGNESIA) suspension 30 mL  30 mL Oral Daily PRN Audery Amel, MD      . nicotine (NICODERM CQ - dosed in mg/24 hours) patch 21 mg  21 mg Transdermal Daily Juwuan Sedita B Zofia Peckinpaugh, MD      . prazosin (MINIPRESS) capsule 2 mg  2 mg Oral BID Eiliana Drone B Lenix Benoist, MD      . traZODone (DESYREL) tablet 100 mg  100 mg Oral QHS Kassi Esteve B Amad Mau, MD       PTA Medications: Prescriptions Prior to Admission  Medication Sig Dispense Refill Last Dose  . escitalopram (LEXAPRO) 10 MG tablet Take 10 mg by mouth daily.   unknown  .  LORazepam (ATIVAN) 0.5 MG tablet Take 0.5 mg by mouth at bedtime.   unknown  . traMADol (ULTRAM) 50 MG tablet Take by mouth every 6 (six) hours as needed.   unknown    Musculoskeletal: Strength & Muscle Tone: within normal limits Gait & Station: normal Patient leans: N/A  Psychiatric Specialty Exam: I reviewed physical exam performed in the emergency room and agree with the findings. Physical Exam  Nursing note and vitals reviewed.   Review of Systems   Psychiatric/Behavioral: Positive for depression, substance abuse and suicidal ideas. The patient is nervous/anxious.   All other systems reviewed and are negative.   Blood pressure (!) 142/95, pulse 78, temperature 98.7 F (37.1 C), temperature source Oral, resp. rate 18, height 5\' 7"  (1.702 m), weight 95.7 kg (211 lb), last menstrual period 10/25/2015, SpO2 97 %.Body mass index is 33.05 kg/m.  See SRA.                                                  Sleep:  Number of Hours: 7.25       Treatment Plan Summary: Daily contact with patient to assess and evaluate symptoms and progress in treatment and Medication management   Ms. Crandell is a 35 year old female with a history of depression and anxiety admitted after suicide attempt by medication overdose.  1. Suicidal ideation. The patient is able to contract for safety in the hospital.  2. Mood. She has been recently started on Lexapro by her primary care provider. We will add Abilify for augmentation. Will try Luvox tonight for OCD type symptoms.  3. PTSD. We will start Minipress twice daily.  4. Alcohol abuse. The patient denies heavy drinking but was drinking on the night of overdose. There are no symptoms of withdrawal. Vital signs are stable.  5. Metabolic syndrome monitoring. Lipid profile, TSH, hemoglobin A1c and prolactin are pending.  6. Disposition. She will be discharge to her apartment. She will follow up with a new psychiatrist.   Observation Level/Precautions:  15 minute checks  Laboratory:  CBC Chemistry Profile UDS UA  Psychotherapy:    Medications:    Consultations:    Discharge Concerns:    Estimated LOS:  Other:     I certify that inpatient services furnished can reasonably be expected to improve the patient's condition.    Kristine Linea, MD 8/10/20177:37 AM

## 2015-10-27 NOTE — Plan of Care (Signed)
Problem: Pain Managment: Goal: General experience of comfort will improve Outcome: Not Progressing Pt reports pain in ankle. Requests higher dose of medications

## 2015-10-27 NOTE — [facility] Group Notes (Signed)
[facility] LCSW Group Therapy  10/27/2015 10:35 AM  Type of Therapy:  Group Therapy  Participation Level:  Pt did not attend group. CSW invited pt to group.   Summary of Progress/Problems: Balance in life: Patients will discuss the concept of balance and how it looks and feels to be unbalanced. Pt will identify areas in their life that is unbalanced and ways to become more balanced.   Aristides Luckey G. Garnette CzechSampson MSW, LCSWA 10/27/2015, 10:37 AM

## 2015-10-27 NOTE — [facility] Suicide Risk Assessment (Signed)
[facility]   Nursing information obtained from:  Patient Demographic factors:  Caucasian, Low socioeconomic status, Living alone Current Mental Status:  NA (denies) Loss Factors:  Financial problems / change in socioeconomic status Historical Factors:  Family history of mental illness or substance abuse Risk Reduction Factors:  Religious beliefs about death, Employed, Positive social support  Total Time spent with patient: 1 hour Principal Problem: Severe recurrent major depression without psychotic features (HCC) Diagnosis:   Patient Active Problem List   Diagnosis Date Noted  . Severe recurrent major depression without psychotic features (HCC) [F33.2] 10/26/2015    Priority: High  . Overdose [T50.901A] 10/26/2015  . Alcohol use disorder, mild, abuse [F10.10] 10/26/2015  . Involuntary commitment [Z04.6] 10/26/2015  . Tobacco use disorder [F17.200] 10/26/2015   Subjective Data: Depression, anxiety, suicide attempt.  Continued Clinical Symptoms:  Alcohol Use Disorder Identification Test Final Score (AUDIT): 5 The "Alcohol Use Disorders Identification Test", Guidelines for Use in Primary Care, Second Edition.  World Science writerHealth Organization [facility](WHO). Score between 0-7:  no or low risk or alcohol related problems. Score between 8-15:  moderate risk of alcohol related problems. Score between 16-19:  high risk of alcohol related problems. Score 20 or above:  warrants further diagnostic evaluation for alcohol dependence and treatment.   CLINICAL FACTORS:   Severe Anxiety and/or Agitation Depression:   Impulsivity Alcohol/Substance Abuse/Dependencies   Musculoskeletal: Strength & Muscle Tone: within normal limits Gait & Station: normal Patient leans: N/A  Psychiatric Specialty Exam: Physical Exam  Nursing note and vitals reviewed.   Review of Systems  Psychiatric/Behavioral: Positive for depression and suicidal ideas. The patient is nervous/anxious.   All  other systems reviewed and are negative.   Blood pressure (!) 142/95, pulse 78, temperature 98.7 F (37.1 C), temperature source Oral, resp. rate 18, height 5\' 7"  (1.702 m), weight 95.7 kg (211 lb), last menstrual period 10/25/2015, SpO2 97 %.Body mass index is 33.05 kg/m.  General Appearance: Casual  Eye Contact:  Good  Speech:  Clear and Coherent  Volume:  Decreased  Mood:  Anxious and Depressed  Affect:  Blunt  Thought Process:  Goal Directed  Orientation:  Full (Time, Place, and Person)  Thought Content:  WDL  Suicidal Thoughts:  Yes.  with intent/plan  Homicidal Thoughts:  No  Memory:  Immediate;   Fair Recent;   Fair Remote;   Fair  Judgement:  Impaired  Insight:  Shallow  Psychomotor Activity:  Normal  Concentration:  Concentration: Fair and Attention Span: Fair  Recall:  [employer]Fair  Fund of Knowledge:  Fair  Language:  Fair  Akathisia:  No  Handed:  Right  AIMS (if indicated):     Assets:  Communication Skills Desire for Improvement Financial Resources/Insurance Housing Physical Health Resilience Social Support Transportation  ADL's:  Intact  Cognition:  WNL  Sleep:  Number of Hours: 7.25      COGNITIVE FEATURES THAT CONTRIBUTE TO RISK:  None    SUICIDE RISK:   Moderate:  Frequent suicidal ideation with limited intensity, and duration, some specificity in terms of plans, no associated intent, good self-control, limited dysphoria/symptomatology, some risk factors present, and identifiable protective factors, including available and accessible social support.   PLAN OF CARE: Hospital admission, medication management, substance abuse counseling, discharge planning.  [name] is a 35 year old female with a history of depression and anxiety admitted after suicide attempt by medication overdose.  1. Suicidal ideation. The patient is able to contract for safety in the  hospital.  2. Mood. She has been recently started on Lexapro by her primary care provider. We  will add Abilify for augmentation. Will try Luvox tonight for OCD type symptoms.  3. PTSD. We will start Minipress twice daily.  4. Alcohol abuse. The patient denies heavy drinking but was drinking on the night of overdose. There are no symptoms of withdrawal. Vital signs are stable.  5. Metabolic syndrome monitoring. Lipid profile, TSH, hemoglobin A1c and prolactin are pending.  6. Disposition. She will be discharge to her apartment. She will follow up with a new psychiatrist.  I certify that inpatient services furnished can reasonably be expected to improve the patient's condition.  Kristine Linea, MD 10/27/2015, 7:31 AM

## 2015-10-27 NOTE — [facility] Suicide Risk Assessment (Signed)
[facility] INPATIENT:  Family/Significant Other Suicide Prevention Education  Suicide Prevention Education:  Patient Refusal for Family/Significant Other Suicide Prevention Education: The patient Maria Sandoval has refused to provide written consent for family/significant other to be provided Family/Significant Other Suicide Prevention Education during admission and/or prior to discharge.  Physician notified. Pt stated that she does not want her family to be concerned and that she is able to handle her hospitalization by herself.  Lynden OxfordKadijah R Saveah Bahar, MSW, LCSW-A 10/27/2015, 12:01 PM

## 2015-10-27 NOTE — [facility] Group Notes (Signed)
Goals Group  Date/Time:  Type of Therapy and Topic: Group Therapy: Goals Group: SMART Goals  ?  Participation Level: Moderate  ?  Description of Group:  ?  The purpose of a daily goals group is to assist and guide patients in setting recovery/wellness-related goals. The objective is to set goals as they relate to the crisis in which they were admitted. Patients will be using SMART goal modalities to set measurable goals. Characteristics of realistic goals will be discussed and patients will be assisted in setting and processing how one will reach their goal. Facilitator will also assist patients in applying interventions and coping skills learned in psycho-education groups to the SMART goal and process how one will achieve defined goal.  ?  Therapeutic Goals:  ?  -Patients will develop and document one goal related to or their crisis in which brought them into treatment.  -Patients will be guided by LCSW using SMART goal setting modality in how to set a measurable, attainable, realistic and time sensitive goal.  -Patients will process barriers in reaching goal.  -Patients will process interventions in how to overcome and successful in reaching goal.  ?  Patient's Goal: Pt invited but did not attend. ?  Therapeutic Modalities:  Motivational Interviewing  Cognitive Behavioral Therapy  Crisis Intervention Model  SMART goals setting  Lynden OxfordKadijah R. Jennet Scroggin, MSW, LCSW-A 10/27/15  10:51AM

## 2015-10-28 LAB — PROLACTIN: Prolactin: 23.2 ng/mL (ref 4.8–23.3)

## 2015-10-28 LAB — GLUCOSE, CAPILLARY: Glucose-Capillary: 118 mg/dL — ABNORMAL HIGH (ref 65–99)

## 2015-10-28 MED ORDER — ESCITALOPRAM OXALATE 20 MG PO TABS: 20.0000 mg | ORAL_TABLET | Freq: Every day | ORAL | 1 refills | Status: DC

## 2015-10-28 MED ORDER — ESCITALOPRAM OXALATE 10 MG PO TABS
20.0000 mg | ORAL_TABLET | Freq: Every day | ORAL | Status: DC
Start: 2015-10-28 — End: 2015-10-28
  Administered 2015-10-28: 20 mg via ORAL
  Filled 2015-10-28: qty 2

## 2015-10-28 MED ORDER — RISPERIDONE 1 MG PO TABS: 1.0000 mg | ORAL_TABLET | Freq: Every day | ORAL | Status: DC

## 2015-10-28 MED ORDER — PRAZOSIN HCL 2 MG PO CAPS: 2.0000 mg | ORAL_CAPSULE | Freq: Two times a day (BID) | ORAL | 1 refills | Status: DC

## 2015-10-28 MED ORDER — RISPERIDONE 1 MG PO TABS: 1.0000 mg | ORAL_TABLET | Freq: Every day | ORAL | 1 refills | Status: DC

## 2015-10-28 NOTE — Plan of Care (Signed)
Problem: Medication: Goal: Compliance with prescribed medication regimen will improve Outcome: Progressing Pt compliant with medication regimen.   

## 2015-10-28 NOTE — [facility] Group Notes (Signed)
[facility] Group Notes:  (Nursing/MHT/Case Management/Adjunct)  Date:  10/28/2015  Time:  3:52 AM  Type of Therapy:  Psychoeducational Skills  Participation Level:  Active  Participation Quality:  Appropriate  Affect:  Appropriate  Cognitive:  Appropriate  Insight:  Appropriate and Good  Engagement in Group:  Engaged  Modes of Intervention:  Discussion, Socialization and Support  Summary of Progress/Problems:  Chancy MilroyLaquanda Y Kazandra Forstrom 10/28/2015, 3:52 AM

## 2015-10-28 NOTE — Progress Notes (Signed)
Pt in room, in bed asleep with 1:1 sitter in room. 

## 2015-10-28 NOTE — Discharge Summary (Signed)
Physician Discharge Summary Note  Patient:  Maria Sandoval is an 35 y.o., female MRN:  [id number] DOB:  02/13/1981 Patient phone:  [phone] (home)  Patient address:   [address] [place] [address],  Total Time spent with patient: 30 minutes  Date of Admission:  10/26/2015 Date of Discharge: 10/28/2015  Reason for Admission:  Suicide attempt.   Identifying data. Ms. Maria Sandoval is a 35 year old female with a history of depression and anxiety.  Chief complaint. "I try to throw my boyfriend's attention."  History of present illness. Information was obtained from the patient and the chart. The patient has a long history of depression beginning at the age of 35. She has been tried on numerous medications for depression and bipolar depression with little improvement. She was a part of a [employer] study using Depakote. She has been in therapy multiple times but continuous do feel depressed and anxious. She broke up with her boyfriend and became even more depressed with poor sleep, decreased appetite, anhedonia, hopelessness worthlessness, poor energy and concentration, social isolation, and crying spells. On the night of admission she had several shots of vodka and took four tablets of tramadol that are prescribed for injured ankle. He thinks that her boyfriend called the police. She was admitted to psychiatry for stabilization. The patient goes back and forth saying that it was or wasn't suicide attempt. In any case she is done with her boyfriend and she realized that he doesn't care. She denies psychotic symptoms or symptoms suggestive of bipolar mania. She endorses many symptoms of anxiety with panic attacks, social anxiety, and OCD. She also believes that she suffers PTSD at the age of 35 her parents forced her to have an abortion. 10 years ago she became pregnant again but had miscarriage while her sister at the same time delivered a healthy baby. The patient finds it difficult to be around her  family and her niece. She denies excessive alcohol or illicit substance use.   Past psychiatric history. She denies prior hospitalizations or suicide attempts. She has been tried on all antidepressants that I can name except for the Remeron. She was also tried on Depakote, Tegretol, and Trileptal. She has never been on an antipsychotic.  Family psychiatric history. Mother with depression and anxiety, her aunt and her uncle had alcohol problems.  Social history. She lives independently with two cats. She works at [employer]. She just got out of not so good relationship.  Principal Problem: Severe recurrent major depression without psychotic features [facility](HCC) Discharge Diagnoses: Patient Active Problem List   Diagnosis Date Noted  . Severe recurrent major depression without psychotic features (HCC) [F33.2] 10/26/2015    Priority: High  . Overdose [T50.901A] 10/26/2015  . Alcohol use disorder, mild, abuse [F10.10] 10/26/2015  . Involuntary commitment [Z04.6] 10/26/2015  . Tobacco use disorder [F17.200] 10/26/2015    Past Medical History:  Past Medical History:  Diagnosis Date  . Anxiety disorder     Past Surgical History:  Procedure Laterality Date  . ANKLE SURGERY Right    Family History: History reviewed. No pertinent family history.  Social History:  History  Alcohol Use  . Yes     History  Drug Use No    Social History   Social History  . Marital status: Single    Spouse name: N/A  . Number of children: N/A  . Years of education: N/A   Social History Main Topics  . Smoking status: Current Every Day Smoker  Types: Cigarettes  . Smokeless tobacco: Never Used  . Alcohol use Yes  . Drug use: No  . Sexual activity: Not Asked   Other Topics Concern  . None   Social History Narrative  . None    Hospital Course:    Ms. Maria Sandoval is a 35 year old female with a history of depression and anxiety admitted after suicide attempt by medication overdose.  1.  Suicidal ideation. This has resolved. The patient is able to contract for safety. She is forward thinking and optimistic about the future.   2. Mood. She has been recently started on Lexapro by her primary care provider. We increased her dose to 20 mg and added Risperdal for augmentation.   3. PTSD. We started Minipress twice daily.  4. Alcohol abuse. The patient denies heavy drinking but was drinking on the night of overdose. There are no symptoms of withdrawal. Vital signs are stable.  5. Metabolic syndrome monitoring. Lipid profile, TSH, hemoglobin A1c are normal. Prolactin 23.  6. Disposition. She was discharged to her apartment. She will follow up with [facility].   Physical Findings: AIMS: Facial and Oral Movements Muscles of Facial Expression: None, normal Lips and Perioral Area: None, normal Jaw: None, normal Tongue: None, normal,Extremity Movements Upper (arms, wrists, hands, fingers): None, normal Lower (legs, knees, ankles, toes): None, normal, Trunk Movements Neck, shoulders, hips: None, normal, Overall Severity Severity of abnormal movements (highest score from questions above): None, normal Incapacitation due to abnormal movements: None, normal Patient's awareness of abnormal movements (rate only patient's report): Aware, no distress, Dental Status Current problems with teeth and/or dentures?: No Does patient usually wear dentures?: No  CIWA:    COWS:     Musculoskeletal: Strength & Muscle Tone: within normal limits Gait & Station: normal Patient leans: N/A  Psychiatric Specialty Exam: Physical Exam  Nursing note and vitals reviewed.   Review of Systems  Musculoskeletal: Positive for joint pain.  Psychiatric/Behavioral: Positive for depression. The patient is nervous/anxious.   All other systems reviewed and are negative.   Blood pressure 107/71, pulse 85, temperature 98.3 F (36.8 C), temperature source Oral, resp. rate 18, height 5\' 7"  (1.702 m), weight 95.7  kg (211 lb), last menstrual period 10/25/2015, SpO2 97 %.Body mass index is 33.05 kg/m.  See SRA.                                                  Sleep:  Number of Hours: 7     Have you used any form of tobacco in the last 30 days? (Cigarettes, Smokeless Tobacco, Cigars, and/or Pipes): No  Has this patient used any form of tobacco in the last 30 days? (Cigarettes, Smokeless Tobacco, Cigars, and/or Pipes) Yes, No  Blood Alcohol level:  Lab Results  Component Value Date   ETH 170 (H) 10/25/2015    Metabolic Disorder Labs:  Lab Results  Component Value Date   HGBA1C 5.1 10/27/2015   Lab Results  Component Value Date   PROLACTIN 23.2 10/27/2015   Lab Results  Component Value Date   CHOL 190 10/27/2015   TRIG 93 10/27/2015   HDL 57 10/27/2015   CHOLHDL 3.3 10/27/2015   VLDL 19 10/27/2015   LDLCALC 114 (H) 10/27/2015    See Psychiatric Specialty Exam and Suicide Risk Assessment completed by Attending Physician prior to discharge.  Discharge destination:  Home  Is patient on multiple antipsychotic therapies at discharge:  No   Has Patient had three or more failed trials of antipsychotic monotherapy by history:  No  Recommended Plan for Multiple Antipsychotic Therapies: NA  Discharge Instructions    Diet - low sodium heart healthy    Complete by:  As directed   Increase activity slowly    Complete by:  As directed       Medication List    STOP taking these medications   LORazepam 0.5 MG tablet Commonly known as:  ATIVAN     TAKE these medications     Indication  escitalopram 20 MG tablet Commonly known as:  LEXAPRO Take 1 tablet (20 mg total) by mouth daily. What changed:  medication strength  how much to take  Indication:  Major Depressive Disorder   prazosin 2 MG capsule Commonly known as:  MINIPRESS Take 1 capsule (2 mg total) by mouth 2 (two) times daily.  Indication:  PTSD   risperiDONE 1 MG tablet Commonly known as:   RISPERDAL Take 1 tablet (1 mg total) by mouth at bedtime.  Indication:  Major Depressive Disorder   traMADol 50 MG tablet Commonly known as:  ULTRAM Take by mouth every 6 (six) hours as needed.  Indication:  Moderate to Moderately Severe Pain      Follow-up Information    [facility]. Go on 11/02/2015.   Why:  Please arrive to the walk-in at [facility] for an assessment for medication management and therapy.  Arrive as early as possible for prompt service.  Please call Unk Pinto at [phone] for questions and assistance.  Contact information: [facility] [place] [address] [place] [address] Ph: [phone] Fax: [phone]          Follow-up recommendations:  Activity:  As tolerated. Diet:  Regular. Other:  Keep follow-up appointments.  Comments:    Signed: Kristine Linea, MD 10/28/2015, 10:14 AM

## 2015-10-28 NOTE — Progress Notes (Signed)
D: Observed pt in dayroom interacting with peers. Patient alert and oriented x4. Patient denies SI/HI/AVH. Pt affect is depressed. Pt stated her day is "getting better." Pt stated "glad I went to groups." pt c/o of ankle pain. A: Offered active listening and support. Provided therapeutic communication. Administered scheduled medications. Gave ultram prn for pain. R: Pt pleasant and cooperative. Pt medication compliant. Will continue Q15 min. checks. Safety maintained.

## 2015-10-28 NOTE — Progress Notes (Signed)
Pt in room, in bed awake with 1:1 sitter in room. 

## 2015-10-28 NOTE — Progress Notes (Signed)
Pt in dayroom with 1:1 sitter watching TV. 

## 2015-10-28 NOTE — Progress Notes (Signed)
Patient denies SI/HI, denies A/V hallucinations. Patient verbalizes understanding of discharge instructions, follow up care and prescriptions. Patient given all belongings from  locker. Patient escorted out by staff, transported by family. 

## 2015-10-28 NOTE — Progress Notes (Signed)
Pt in dayroom with 1:1 sitter watching TV.

## 2015-10-28 NOTE — [facility] Suicide Risk Assessment (Signed)
[facility]   Principal Problem: Severe recurrent major depression without psychotic features [facility](HCC) Discharge Diagnoses:  Patient Active Problem List   Diagnosis Date Noted  . Severe recurrent major depression without psychotic features (HCC) [F33.2] 10/26/2015    Priority: High  . Overdose [T50.901A] 10/26/2015  . Alcohol use disorder, mild, abuse [F10.10] 10/26/2015  . Involuntary commitment [Z04.6] 10/26/2015  . Tobacco use disorder [F17.200] 10/26/2015    Total Time spent with patient: 30 minutes  Musculoskeletal: Strength & Muscle Tone: within normal limits Gait & Station: normal Patient leans: N/A  Psychiatric Specialty Exam: Review of Systems  Psychiatric/Behavioral: Positive for depression. The patient is nervous/anxious.   All other systems reviewed and are negative.   Blood pressure 107/71, pulse 85, temperature 98.3 F (36.8 C), temperature source Oral, resp. rate 18, height 5\' 7"  (1.702 m), weight 95.7 kg (211 lb), last menstrual period 10/25/2015, SpO2 97 %.Body mass index is 33.05 kg/m.  General Appearance: Casual  Eye Contact::  Good  Speech:  Clear and Coherent409  Volume:  Normal  Mood:  Anxious  Affect:  Appropriate  Thought Process:  Goal Directed  Orientation:  Full (Time, Place, and Person)  Thought Content:  WDL  Suicidal Thoughts:  No  Homicidal Thoughts:  No  Memory:  Immediate;   Fair Recent;   Fair Remote;   Fair  Judgement:  Fair  Insight:  Fair  Psychomotor Activity:  Normal  Concentration:  Fair  Recall:  FiservFair  Fund of Knowledge:Fair  Language: Fair  Akathisia:  No  Handed:  Right  AIMS (if indicated):     Assets:  Communication Skills Desire for Improvement Financial Resources/Insurance Housing Physical Health Resilience Social Support  Sleep:  Number of Hours: 7  Cognition: WNL  ADL's:  Intact   Mental Status Per Nursing Assessment::   On Admission:  NA (denies)  Demographic Factors:   Caucasian  Loss Factors: Loss of significant relationship  Historical Factors: Family history of mental illness or substance abuse and Impulsivity  Risk Reduction Factors:   Sense of responsibility to family, Religious beliefs about death, Employed, Positive social support and Positive therapeutic relationship  Continued Clinical Symptoms:  Depression:   Comorbid alcohol abuse/dependence Impulsivity Alcohol/Substance Abuse/Dependencies  Cognitive Features That Contribute To Risk:  None    Suicide Risk:  Minimal: No identifiable suicidal ideation.  Patients presenting with no risk factors but with morbid ruminations; may be classified as minimal risk based on the severity of the depressive symptoms  Follow-up Information    [facility]. Go on 11/02/2015.   Why:  Please arrive to the walk-in at [facility] for an assessment for medication management and therapy.  Arrive as early as possible for prompt service.  Please call Unk PintoHarvey Bryant at [phone] for questions and assistance.  Contact information: [facility] [place] [address] [place] [address] Ph: [phone] Fax: [phone]          Plan Of Care/Follow-up recommendations:  Activity:  as tolerated. Diet:  regular. Other:  keep follow up appointment.  Kristine LineaJolanta Lenix Kidd, MD 10/28/2015, 10:11 AM

## 2015-10-28 NOTE — Progress Notes (Signed)
Pt in room, in bed awake with 1:1 sitter in room.

## 2015-10-28 NOTE — Progress Notes (Signed)
Pt in room, in bed asleep with 1:1 sitter in room.

## 2015-10-28 NOTE — Tx Team (Signed)
Interdisciplinary Treatment Plan Update (Adult)         Date: 10/28/2015   Time Reviewed: 10:30 AM   Progress in Treatment: Improving Attending groups: Yes  Participating in groups: Yes  Taking medication as prescribed: Yes  Tolerating medication: Yes  Family/Significant other contact made: Pt refused family contact Patient understands diagnosis: Yes  Discussing patient identified problems/goals with staff: Yes  Medical problems stabilized or resolved: Yes  Denies suicidal/homicidal ideation: Yes  Issues/concerns per patient self-inventory: Yes  Other:   New problem(s) identified: N/A   Discharge Plan or Barriers: see below   Reason for Continuation of Hospitalization:   Depression   Anxiety   Medication Stabilization   Comments: N/A   Estimated discharge date: 10/28/15    Patient is a 35 year old female admitted for major depression disorder and suicidal ideation. Patient lives in [place], [place]. Patient will benefit from crisis stabilization, medication evaluation, group therapy, and psycho education in addition to case management for discharge planning. Patient and CSW reviewed pt's identified goals and treatment plan. Pt verbalized understanding and agreed to treatment plan.    Review of initial/current patient goals per problem list:  1. Goal(s): Patient will participate in aftercare plan   Met: Yes  Target date: 3-5 days post admission date   As evidenced by: Patient will participate within aftercare plan AEB aftercare provider and housing plan at discharge being identified.   Pt will follow-up with [facility].  2. Goal (s): Patient will exhibit decreased depressive symptoms and suicidal ideations.   Met: Yes  Target date: 3-5 days post admission date   As evidenced by: Patient will utilize self-rating of depression at 3 or below and demonstrate decreased signs of depression or be deemed stable for discharge by MD.   Pt denies SI/HI.  Pt  reports he is safe for discharge. Adequate for discharge per MD.   3. Goal(s): Patient will demonstrate decreased signs and symptoms of anxiety.   Met: Yes  Target date: 3-5 days post admission date   As evidenced by: Patient will utilize self-rating of anxiety at 3 or below and demonstrated decreased signs of anxiety, or be deemed stable for discharge by MD   Pt denies anxiety symptoms at this time. Adequate for discharge per MD.    Attendees:  Patient: Maria Sandoval Family:  Physician: Orson Slick , MD   10/28/2015 10:30 AM  Nursing: Floyde Parkins, RN    10/28/2015 10:30 AM  Clinical Social Worker: Glorious Peach, [place]  10/28/2015 10:30AM

## 2015-10-28 NOTE — Plan of Care (Signed)
Problem: [facility] Participation in Recreation Therapeutic Interventions Goal: STG-Patient will demonstrate improved self esteem by identif STG: Self-Esteem - Within 3 treatment sessions, patient will verbalize at least 5 positive affirmation statements in one treatment session to increase self-esteem post d/c.  Outcome: Completed/Met Date Met: 10/28/15 Treatment Session 1; Completed 1 out of 1: At approximately 10:10 am, LRT met with patient in patient room. Patient verbalized 5 positive affirmation statements. Patient reported it felt "good". LRT encouraged patient to continue saying positive affirmation statements.  Leonette Monarch, LRT/CTRS 08.11.17 12:51 pm Goal: STG-Other Recreation Therapy Goal (Specify) STG: Stress Management - Within 3 treatment sessions, patient will verbalize understanding of the stress management techniques in one treatment session to increase stress management skills post d/c.  Outcome: Completed/Met Date Met: 10/28/15 Treatment Session 1; Completed 1 out of 1: At approximately 10:10 am, LRT met with patient in patient room. LRT educated and provided patient with handouts on stress management techniques. Patient verbalized understanding. LRT encouraged patient to read over and practice the stress management techniques.  Leonette Monarch, LRT/CTRS 08.11.17 12:52 pm

## 2015-10-28 NOTE — Progress Notes (Signed)
Recreation Therapy Notes  INPATIENT RECREATION TR PLAN  Patient Details Name: Maria Sandoval MRN: [id number] DOB: [birth year] Today's Date: 10/28/2015  Rec Therapy Plan Is patient appropriate for Therapeutic Recreation?: Yes Treatment times per week: At least once a week TR Treatment/Interventions: 1:1 session, Group participation (Comment) (Appropriate participation in daily recreational therapy tx)  Discharge Criteria Pt will be discharged from therapy if:: Treatment goals are met, No prgress toward goals, Discharged Treatment plan/goals/alternatives discussed and agreed upon by:: Patient/family  Discharge Summary Short term goals set: See Care Plan Short term goals met: Complete Progress toward goals comments: One-to-one attended Which groups?: Leisure education One-to-one attended: Self-esteem, stress management Reason goals not met: N/A Therapeutic equipment acquired: None Reason patient discharged from therapy: Discharge from hospital Pt/family agrees with progress & goals achieved: Yes Date patient discharged from therapy: 10/28/15   Leonette Monarch, LRT/CTRS 10/28/2015, 12:53 PM

## 2015-10-31 NOTE — Progress Notes (Signed)
  [facility] :  Will you be returning to the same living situation after discharge:  Yes,    At discharge, do you have transportation home?: Yes,    Do you have the ability to pay for your medications: Yes,     Release of information consent forms completed and in the chart;  Patient's signature needed at discharge.  Patient to Follow up at: Follow-up Information    [facility]. Go on 10/31/2015.   Why:  Please [facility] to [facility] follow-up appointment at 7:30AM for an assessment for medication management and therapy.  Arrive as early as possible for prompt service.  Please call Unk PintoHarvey Bryant at [phone] for questions and assistance.  Contact information: [facility] [place] [address] [place] [address] Ph: [phone] Fax: [phone]          Next level of care provider has access to [facility] Link:no  Safety Planning and Suicide Prevention discussed: Yes,     Have you used any form of tobacco in the last 30 days? (Cigarettes, Smokeless Tobacco, Cigars, and/or Pipes): No  Has patient been referred to the Quitline?: N/A patient is not a smoker  Patient has been referred for addiction treatment: Yes  Glennon MacSara P Adonte Vanriper, MSW, LCSW 10/31/2015, 3:56 PM

## 2017-05-20 LAB — HM HIV SCREENING LAB: HM HIV Screening: NEGATIVE

## 2017-05-20 LAB — HM PAP SMEAR: HM Pap smear: NEGATIVE

## 2017-10-23 DIAGNOSIS — N946 Dysmenorrhea, unspecified: Secondary | ICD-10-CM | POA: Insufficient documentation

## 2019-01-30 ENCOUNTER — Encounter: Payer: Self-pay | Admitting: Emergency Medicine

## 2019-01-30 ENCOUNTER — Other Ambulatory Visit: Payer: Self-pay

## 2019-01-30 ENCOUNTER — Emergency Department
Admission: EM | Admit: 2019-01-30 | Discharge: 2019-01-30 | Disposition: A | Discharge status: Home / Self Care | Payer: No Typology Code available for payment source | Attending: Emergency Medicine | Admitting: Emergency Medicine

## 2019-01-30 DIAGNOSIS — R519 Headache, unspecified: Secondary | ICD-10-CM | POA: Insufficient documentation

## 2019-01-30 DIAGNOSIS — Z5321 Procedure and treatment not carried out due to patient leaving prior to being seen by health care provider: Secondary | ICD-10-CM | POA: Diagnosis not present

## 2019-01-30 NOTE — ED Triage Notes (Addendum)
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

## 2019-01-30 NOTE — ED Notes (Signed)
Pt reports leaving now and going to [facility] due to long wait

## 2019-08-03 ENCOUNTER — Ambulatory Visit: Payer: Self-pay

## 2019-08-03 DIAGNOSIS — E669 Obesity, unspecified: Secondary | ICD-10-CM

## 2019-08-03 DIAGNOSIS — N946 Dysmenorrhea, unspecified: Secondary | ICD-10-CM

## 2021-01-17 ENCOUNTER — Ambulatory Visit (LOCAL_COMMUNITY_HEALTH_CENTER): Payer: Medicaid Other | Admitting: Physician Assistant

## 2021-01-17 ENCOUNTER — Other Ambulatory Visit: Payer: Self-pay

## 2021-01-17 ENCOUNTER — Encounter: Payer: Self-pay | Admitting: Physician Assistant

## 2021-01-17 VITALS — BP 140/94 | Ht 67.0 in | Wt 198.4 lb

## 2021-01-17 DIAGNOSIS — Z3009 Encounter for other general counseling and advice on contraception: Secondary | ICD-10-CM

## 2021-01-17 DIAGNOSIS — Z1388 Encounter for screening for disorder due to exposure to contaminants: Secondary | ICD-10-CM | POA: Diagnosis not present

## 2021-01-17 DIAGNOSIS — Z0389 Encounter for observation for other suspected diseases and conditions ruled out: Secondary | ICD-10-CM | POA: Diagnosis not present

## 2021-01-17 DIAGNOSIS — Z3049 Encounter for surveillance of other contraceptives: Secondary | ICD-10-CM | POA: Diagnosis not present

## 2021-01-17 DIAGNOSIS — Z0000 Encounter for general adult medical examination without abnormal findings: Secondary | ICD-10-CM | POA: Diagnosis not present

## 2021-01-17 DIAGNOSIS — Z113 Encounter for screening for infections with a predominantly sexual mode of transmission: Secondary | ICD-10-CM

## 2021-01-17 LAB — HM HEPATITIS C SCREENING LAB: HM Hepatitis Screen: NEGATIVE

## 2021-01-17 LAB — HM HIV SCREENING LAB: HM HIV Screening: NEGATIVE

## 2021-01-17 NOTE — Progress Notes (Addendum)
Family Planning Visit- Repeat Yearly Visit  Subjective:  Maria Sandoval is a 40 y.o. No obstetric history on file.  being seen today for an annual wellness visit and to discuss contraception options.   The patient is currently using Condoms for pregnancy prevention. Patient does not want a pregnancy in the next year. Patient has the following medical problems: has Severe recurrent major depression without psychotic features (HCC); Overdose; Alcohol use disorder, mild, abuse; Involuntary commitment; Tobacco use disorder; Dysmenorrhea; and Obesity, unspecified on their problem list.  Chief Complaint  Patient presents with   Contraception    Annual exam    Patient reports that she is doing well with condoms and desires to to continue with this as her BCM.  States that she is here for her physical.  Denies any changes to her personal and family histories since her last physical. Reports that she has noticed that with her periods she has had worse cramping for the past few months but uses OTC analgesics with relief.  Per chart review, CBE is due today and pap is due in 2024.  Patient denies other concerns.    See flowsheet for other program required questions.   Body mass index is 31.07 kg/m. - Patient is eligible for diabetes screening based on BMI and age >9?  not applicable HA1C ordered? not applicable  Patient reports 1 of partners in last year. Desires STI screening?  Yes   Has patient been screened once for HCV in the past?  No  No results found for: HCVAB  Does the patient have current of drug use, have a partner with drug use, and/or has been incarcerated since last result? No  If yes-- Screen for HCV through [facility]   Does the patient meet criteria for HBV testing? No  Criteria:  -Household, sexual or needle sharing contact with HBV -History of drug use -HIV positive -Those with known Hep C   Health Maintenance Due  Topic Date Due   COVID-19 Vaccine (1) Never  done   Hepatitis C Screening  Never done   INFLUENZA VACCINE  Never done    Review of Systems  All other systems reviewed and are negative.  The following portions of the patient's history were reviewed and updated as appropriate: allergies, current medications, past family history, past medical history, past social history, past surgical history and problem list. Problem list updated.  Objective:   Vitals:   01/17/21 1555  BP: (!) 140/94  Weight: 198 lb 6.4 oz (90 kg)  Height: 5\' 7"  (1.702 m)    Physical Exam Constitutional:      General: She is not in acute distress.    Appearance: Normal appearance.  HENT:     Head: Normocephalic and atraumatic.     Mouth/Throat:     Mouth: Mucous membranes are moist.     Pharynx: Oropharynx is clear. No oropharyngeal exudate or posterior oropharyngeal erythema.  Eyes:     Conjunctiva/sclera: Conjunctivae normal.  Neck:     Thyroid: No thyroid mass, thyromegaly or thyroid tenderness.  Cardiovascular:     Rate and Rhythm: Normal rate and regular rhythm.  Pulmonary:     Effort: Pulmonary effort is normal.     Breath sounds: Normal breath sounds.  Chest:  Breasts:    Right: Normal. No mass, nipple discharge, skin change or tenderness.     Left: Normal. No mass, nipple discharge, skin change or tenderness.  Abdominal:     Palpations: Abdomen is  soft. There is no mass.     Tenderness: There is no abdominal tenderness. There is no guarding or rebound.  Musculoskeletal:     Cervical back: Neck supple. No tenderness.  Lymphadenopathy:     Cervical: No cervical adenopathy.     Upper Body:     Right upper body: No supraclavicular, axillary or pectoral adenopathy.     Left upper body: No supraclavicular, axillary or pectoral adenopathy.  Skin:    General: Skin is warm and dry.     Findings: No bruising, erythema, lesion or rash.  Neurological:     Mental Status: She is alert and oriented to person, place, and time.  Psychiatric:         Mood and Affect: Mood normal.        Behavior: Behavior normal.        Thought Content: Thought content normal.        Judgment: Judgment normal.      Assessment and Plan:  Maria Sandoval is a 40 y.o. female No obstetric history on file. presenting to the [facility] Department for an yearly wellness and contraception visit  Contraception counseling: Reviewed all forms of birth control options in the tiered based approach. available including abstinence; over the counter/barrier methods; hormonal contraceptive medication including pill, patch, ring, injection,contraceptive implant, ECP; hormonal and nonhormonal IUDs; permanent sterilization options including vasectomy and the various tubal sterilization modalities. Risks, benefits, and typical effectiveness rates were reviewed.  Questions were answered.  Written information was also given to the patient to review.  Patient desires to continue with condoms, this was prescribed for patient. She will follow up in  1 year and prn for surveillance.  She was told to call with any further questions, or with any concerns about this method of contraception.  Emphasized use of condoms 100% of the time for STI prevention.  Patient was not a candidate for ECP today.  1. Encounter for counseling regarding contraception Reviewed with patient as above re: BCM options. Reviewed with patient when to call clinic with concerns. Enc condoms with all sex for STD protection.   2. Screening for STD (sexually transmitted disease) Await test results.  Counseled patient that RN will call if needs to RTC for treatment once results are back.   - HIV/HCV [place] [facility] - Syphilis Serology,  Lab  3. Surveillance for contraception barrier or spermicide Continue with condoms and counseled that she can add OTC spermicide for extra effectiveness.  4. Well woman exam (no gynecological exam) Reviewed with patient healthy habits to maintain general  health. Enc MVI 1 po daily. Counseled patient that as we get older, our periods and symptoms related to them can change and that she should continue with OTC analgesics for cramping and let us know if the analgesics stop working for the relief of cramps. Enc to follow up with PCP re: elevated BP and rec to check at pharmacy or home to monitor.  If BP is >140/90 on more than 3 separate occasions should consult PCP for evaluation.  Enc low sodium, low caffeine diet and regular exercise. Enc to establish with/ follow up with PCP for primary care concerns, age appropriate screenings and illness.      Return in about 1 year (around 01/17/2022) for RP and prn.  No future appointments.  Matt Holmes, PA

## 2021-05-15 ENCOUNTER — Telehealth: Payer: Self-pay | Admitting: Family Medicine

## 2021-05-15 NOTE — Telephone Encounter (Signed)
Pt called & wants PE & Pap, having symptoms

## 2021-05-22 ENCOUNTER — Ambulatory Visit: Payer: Medicaid Other

## 2021-05-30 ENCOUNTER — Ambulatory Visit (LOCAL_COMMUNITY_HEALTH_CENTER): Payer: Medicaid Other | Admitting: Family Medicine

## 2021-05-30 ENCOUNTER — Other Ambulatory Visit: Payer: Self-pay

## 2021-05-30 VITALS — BP 138/91 | Ht 67.0 in | Wt 214.6 lb

## 2021-05-30 DIAGNOSIS — Z1388 Encounter for screening for disorder due to exposure to contaminants: Secondary | ICD-10-CM | POA: Diagnosis not present

## 2021-05-30 DIAGNOSIS — K5900 Constipation, unspecified: Secondary | ICD-10-CM | POA: Diagnosis not present

## 2021-05-30 DIAGNOSIS — Z113 Encounter for screening for infections with a predominantly sexual mode of transmission: Secondary | ICD-10-CM

## 2021-05-30 DIAGNOSIS — Z3009 Encounter for other general counseling and advice on contraception: Secondary | ICD-10-CM | POA: Diagnosis not present

## 2021-05-30 DIAGNOSIS — Z309 Encounter for contraceptive management, unspecified: Secondary | ICD-10-CM

## 2021-05-30 DIAGNOSIS — Z0389 Encounter for observation for other suspected diseases and conditions ruled out: Secondary | ICD-10-CM | POA: Diagnosis not present

## 2021-05-30 LAB — HEPATITIS B SURFACE ANTIGEN

## 2021-05-30 LAB — WET PREP FOR TRICH, YEAST, CLUE
Trichomonas Exam: NEGATIVE
Yeast Exam: NEGATIVE

## 2021-05-30 LAB — HM HIV SCREENING LAB: HM HIV Screening: NEGATIVE

## 2021-05-30 LAB — HM HEPATITIS C SCREENING LAB: HM Hepatitis Screen: NEGATIVE

## 2021-05-30 NOTE — Progress Notes (Signed)
Pt here for Pelvic exam and STD check.  Wet mount results reviewed, no treatment required per SO.  Berdie Ogren, RN ? ?

## 2021-05-31 NOTE — Progress Notes (Signed)
? ?[facility]  [facility] ? ?Subjective:  ?Maria Sandoval is a 41 y.o. being seen today for  ? ?Chief Complaint  ?Patient presents with  ? Gynecologic Exam  ?  Pelvic and STD screening  ? ? ?Pt in clinic with concerns about abdominal pain. She reports having ED visit on 05/22/2021 and was told she needed pap smear and a colonoscopy.  She denies that STI testing was done at this visit  ? ? ? ?Does the patient have a current or past history of drug use? No  ? No components found for: HCV] ? ? ?Health Maintenance Due  ?Topic Date Due  ? COVID-19 Vaccine (1) Never done  ? INFLUENZA VACCINE  Never done  ? ? ?Review of Systems  ?Gastrointestinal:  Positive for abdominal pain.  ? ?The following portions of the patient's history were reviewed and updated as appropriate: allergies, current medications, past family history, past medical history, past social history, past surgical history and problem list. Problem list updated. ? ? ?See flowsheet for other program required questions. ? ?Objective:  ? ?Vitals:  ? 05/30/21 1442 05/30/21 1448  ?BP: (!) 144/90 (!) 138/91  ?Weight: 214 lb 9.6 oz (97.3 kg)   ?Height: 5\' 7"  (1.702 m)   ? ? ?Physical Exam ?Vitals and nursing note reviewed.  ?Constitutional:   ?   Appearance: Normal appearance.  ?HENT:  ?   Head: Normocephalic and atraumatic.  ?   Mouth/Throat:  ?   Mouth: Mucous membranes are moist.  ?   Pharynx: Oropharynx is clear. No oropharyngeal exudate or posterior oropharyngeal erythema.  ?Pulmonary:  ?   Effort: Pulmonary effort is normal.  ?Abdominal:  ?   General: Abdomen is flat.  ?   Palpations: There is mass.  ?   Tenderness: There is abdominal tenderness. There is no rebound.  ?Genitourinary: ?   General: Normal vulva.  ?   Exam position: Lithotomy position.  ?   Pubic Area: No rash or pubic lice.   ?   Labia:     ?   Right: No rash or lesion.     ?   Left: No rash or lesion.   ?   Vagina: Normal. No vaginal  discharge, erythema, bleeding or lesions.  ?   Cervix: No cervical motion tenderness, discharge, friability, lesion or erythema.  ?   Uterus: Normal.   ?   Adnexa: Right adnexa normal and left adnexa normal.  ?   Rectum: Normal.  ?   Comments: External genitalia without, lice, nits, erythema, edema , lesions or inguinal adenopathy. Vagina with normal mucosa and  bloody discharge and pH >4.  Cervix without visual lesions.  uterus firm tender (pt on menses), mobile, non-tender, no masses, CMT adnexal fullness or tenderness.   ?Lymphadenopathy:  ?   Head:  ?   Right side of head: No preauricular or posterior auricular adenopathy.  ?   Left side of head: No preauricular or posterior auricular adenopathy.  ?   Cervical: No cervical adenopathy.  ?   Upper Body:  ?   Right upper body: No supraclavicular or axillary adenopathy.  ?   Left upper body: No supraclavicular or axillary adenopathy.  ?   Lower Body: No right inguinal adenopathy. No left inguinal adenopathy.  ?Skin: ?   General: Skin is warm and dry.  ?   Findings: No rash.  ?Neurological:  ?   Mental Status: She is alert  and oriented to person, place, and time.  ?Psychiatric:     ?   Mood and Affect: Mood normal.     ?   Behavior: Behavior normal.  ? ? ? ? ?Assessment and Plan:  ?Maria Sandoval is a 41 y.o. female presenting to the [facility] Department for a Women's Health problem visit ? ?1. Screening examination for venereal disease ?Patient accepted all screenings including  wey prep, vaginal CT/GC and bloodwork for HIV/RPR.  ?Patient meets criteria for HepB screening? Yes. Ordered? Yes ?Patient meets criteria for HepC screening? Yes. Ordered? Yes ? ?Wet prep results neg   ?No Treatment needed  ?Discussed time line for State Lab results and that patient will be called with positive results and encouraged patient to call if she had not heard in 2 weeks.  ?Counseled to return or seek care for continued or worsening symptoms ?Recommended condom use  with all sex ? ?Patient is currently using  condoms   to prevent pregnancy.   ?- Chlamydia/Gonorrhea [place] [facility] ?- HIV/HCV [place] [facility] ?- HBV Antigen/Antibody [facility] ?- Syphilis Serology, [place] [facility] ?- WET PREP FOR TRICH, YEAST, CLUE ? ?2. potential constipation ?Pt repots left sided abdominal pain, tender on palpation, firm masses felt on abdominal area.  Discussed with patient about bowel habits.  Pt reports it been couple of days since last BM and they were hard and small.  Pt reports she drinks only about 1.5 glasses of water per day.   ?Discussed methods to help relieve constipation OTC but pt needs to see PCP.   ? ?Pt had CT at ED visit :Colon diverticulosis without evidence of diverticulitis ? ?referred pt to [facility] for PCP.   ? ? ? ? ?No follow-ups on file. ? ?No future appointments. ? ?Wendi Snipes, FNP ? ?

## 2021-06-07 ENCOUNTER — Telehealth: Payer: Self-pay | Admitting: Gerontology

## 2021-06-07 NOTE — Telephone Encounter (Signed)
Called and has application will be dropping off soon  ?

## 2021-06-07 NOTE — Telephone Encounter (Signed)
-----   Message from Efraim Kaufmann, [place] sent at 06/01/2021 10:06 AM EDT ----- ?Regarding: Potential New Pt ?Pls call Ms. Morioka and see if she wants to become a new pt. Got a referral from the health department ? ?

## 2021-06-20 ENCOUNTER — Ambulatory Visit: Payer: Self-pay | Admitting: Gerontology

## 2021-06-20 ENCOUNTER — Encounter: Payer: Self-pay | Admitting: Gerontology

## 2021-06-20 VITALS — BP 135/85 | HR 71 | Temp 98.2°F | Resp 16 | Ht 66.0 in | Wt 208.1 lb

## 2021-06-20 DIAGNOSIS — N946 Dysmenorrhea, unspecified: Secondary | ICD-10-CM

## 2021-06-20 DIAGNOSIS — Z7689 Persons encountering health services in other specified circumstances: Secondary | ICD-10-CM | POA: Insufficient documentation

## 2021-06-20 DIAGNOSIS — R109 Unspecified abdominal pain: Secondary | ICD-10-CM | POA: Insufficient documentation

## 2021-06-20 DIAGNOSIS — R1012 Left upper quadrant pain: Secondary | ICD-10-CM

## 2021-06-20 NOTE — Progress Notes (Signed)
? ?New Patient Office Visit ? ?Subjective:  ?Patient ID: Maria Sandoval, female    DOB: February 25, 1981  Age: 41 y.o. MRN: [id number] ? ?CC:  ?Chief Complaint  ?Patient presents with  ? Establish Care  ? ? ?HPI ?Maria Sandoval  is 41 y/o female who has a history of anxiety and  presents to establish care. ?She was seen at [facility] ED on 05/22/21 for Abdominal pain, and CT scan showed no abnormality in the abdomen and pelvis. Currently, she states that she experiences cramps, left upper and lower abdominal pain  that radiates to her left back, with her monthly period that started 2 years ago. She states that pain starts 1 week prior to her monthly period and resolves after her period. She states that her period lasts 4 days, and her LMP was 05/30/21.  She states that  during her period, pain is constant, using heating pad and tylenol minimally relieves symptoms. She also reports chronic constipation that has being going on, states her stool is usually hard. She states that she has not had Pap smear and Mammogram done. Overall, she states that she's doing well and offers no further complaint. ? ?Past Medical History:  ?Diagnosis Date  ? Anxiety disorder   ? ? ?Past Surgical History:  ?Procedure Laterality Date  ? ANKLE SURGERY Right   ? ? ?Family History  ?Problem Relation Age of Onset  ? Hypertension Mother   ? Diabetes Mother   ? Anxiety disorder Mother   ? Depression Mother   ? Healthy Father   ? Seizures Sister   ? Diabetes Maternal Grandmother   ? Hypertension Maternal Grandmother   ? Heart attack Maternal Grandfather   ? Other Paternal Grandmother   ?     unknown medical history  ? Other Paternal Grandfather   ?     unknown medical hsitory  ? ? ?Social History  ? ?Socioeconomic History  ? Marital status: Single  ?  Spouse name: Not on file  ? Number of children: Not on file  ? Years of education: Not on file  ? Highest education level: Not on file  ?Occupational History  ? Not on file  ?Tobacco Use  ? Smoking status:  Former  ?  Types: Cigarettes  ?  Quit date: 2019  ?  Years since quitting: 4.2  ? Smokeless tobacco: Never  ?Vaping Use  ? Vaping Use: Every day  ? Substances: Nicotine, Flavoring  ?Substance and Sexual Activity  ? Alcohol use: Not Currently  ?  Comment: last use ~ 2019  ? Drug use: Not Currently  ?  Types: Cocaine, Marijuana  ?  Comment: last use ~2021  ? Sexual activity: Yes  ?  Partners: Male  ?  Birth control/protection: Condom  ?Other Topics Concern  ? Not on file  ?Social History Narrative  ? Not on file  ? ?Social Determinants of Health  ? ?Financial Resource Strain: Not on file  ?Food Insecurity: Food Insecurity Present  ? Worried About Programme researcher, broadcasting/film/video in the Last Year: Sometimes true  ? Ran Out of Food in the Last Year: Sometimes true  ?Transportation Needs: No Transportation Needs  ? Lack of Transportation (Medical): No  ? Lack of Transportation (Non-Medical): No  ?Physical Activity: Not on file  ?Stress: Not on file  ?Social Connections: Not on file  ?Intimate Partner Violence: Not on file  ? ? ?ROS ?Review of Systems  ?Constitutional: Negative.   ?HENT: Negative.    ?  Eyes: Negative.   ?Respiratory: Negative.    ?Cardiovascular: Negative.   ?Gastrointestinal:  Positive for abdominal pain and constipation.  ?Endocrine: Negative.   ?Genitourinary: Negative.   ?Musculoskeletal: Negative.   ?Skin: Negative.   ?Neurological: Negative.   ?Hematological: Negative.   ?Psychiatric/Behavioral: Negative.    ? ?Objective:  ? ?Today's Vitals: BP 135/85 (BP Location: Right Arm, Patient Position: Sitting, Cuff Size: Large)   Pulse 71   Temp 98.2 ?F (36.8 ?C) (Oral)   Resp 16   Ht 5\' 6"  (1.676 m)   Wt 208 lb 1.6 oz (94.4 kg)   LMP 05/29/2021 (Exact Date)   SpO2 99%   BMI 33.59 kg/m?  ? ?Physical Exam ?HENT:  ?   Head: Normocephalic and atraumatic.  ?   Nose: Nose normal.  ?   Mouth/Throat:  ?   Mouth: Mucous membranes are moist.  ?Eyes:  ?   Extraocular Movements: Extraocular movements intact.  ?    Conjunctiva/sclera: Conjunctivae normal.  ?   Pupils: Pupils are equal, round, and reactive to light.  ?Cardiovascular:  ?   Rate and Rhythm: Normal rate and regular rhythm.  ?   Pulses: Normal pulses.  ?   Heart sounds: Normal heart sounds.  ?Pulmonary:  ?   Effort: Pulmonary effort is normal.  ?   Breath sounds: Normal breath sounds.  ?Abdominal:  ?   General: Bowel sounds are normal.  ?   Palpations: Abdomen is soft.  ?Genitourinary: ?   Comments: Deferred per patient ?Musculoskeletal:     ?   General: Normal range of motion.  ?   Cervical back: Normal range of motion.  ?Skin: ?   General: Skin is warm.  ?Neurological:  ?   General: No focal deficit present.  ?   Mental Status: She is alert and oriented to person, place, and time. Mental status is at baseline.  ?Psychiatric:     ?   Mood and Affect: Mood normal.     ?   Behavior: Behavior normal.     ?   Thought Content: Thought content normal.     ?   Judgment: Judgment normal.  ? ? ?Assessment & Plan:  ? ? ? ?1. Encounter to establish care ?-She was provided with [facility]CP flyer and encouraged to schedule Pap smear and Mammogram. ? ?2. Left upper quadrant abdominal pain ?- She was encouraged to complete Financial application for  ?- Ambulatory referral to Gastroenterology for evaluation of recurrent abdominal pain. ? ?3. Dysmenorrhea ?- She was encouraged to complete Financial application for  ?- Ambulatory referral to Obstetrics / Gynecology ? ? ? ? ?Follow-up: Return in about 6 weeks (around 08/01/2021), or if symptoms worsen or fail to improve.  ? ?Tonea Leiphart 08/03/2021, NP ? ?

## 2021-06-22 ENCOUNTER — Other Ambulatory Visit (HOSPITAL_COMMUNITY)
Admission: RE | Admit: 2021-06-22 | Discharge: 2021-06-22 | Disposition: A | Discharge status: Home / Self Care | Payer: Self-pay | Source: Ambulatory Visit | Attending: Obstetrics | Admitting: Obstetrics

## 2021-06-22 ENCOUNTER — Ambulatory Visit (INDEPENDENT_AMBULATORY_CARE_PROVIDER_SITE_OTHER): Payer: Self-pay | Admitting: Obstetrics

## 2021-06-22 VITALS — BP 143/85 | HR 78 | Ht 67.0 in | Wt 209.2 lb

## 2021-06-22 DIAGNOSIS — N898 Other specified noninflammatory disorders of vagina: Secondary | ICD-10-CM | POA: Insufficient documentation

## 2021-06-22 DIAGNOSIS — R109 Unspecified abdominal pain: Secondary | ICD-10-CM

## 2021-06-22 DIAGNOSIS — N946 Dysmenorrhea, unspecified: Secondary | ICD-10-CM

## 2021-06-22 DIAGNOSIS — K5909 Other constipation: Secondary | ICD-10-CM

## 2021-06-22 NOTE — Progress Notes (Signed)
GYN ENCOUNTER ? ?Encounter for Dysmenorrhea and Abdominal Pain ? ?Subjective ? ?HPI: Maria Sandoval is a 41 y.o. G3P0 who presents today for evaluation of abdominal pain. She reports that over the past two years, she has had very painful left-sided abdominal and back pain, bloating, and cramping with her periods with heavy bleeding on the first day. She has also developed chronic constipation and painful bowel movements. Prior to this, she never had any pain or cramping during her periods. Her obstetric history includes 2 IABs with D&C and 1 SAB. She denies any pelvic surgery. She recently had a CT that showed no abnormalities of the uterus or adnexae. She has been evaluated by her PCP, who referred her to GI for an endoscopy and colonoscopy (appt scheduled for August). Her family history is significant for an uncle with colon cancer diagnosed in his 12s. Her mother had a hysterectomy in her 57s for menorrhagia. ? ?Past Medical History:  ?Diagnosis Date  ? Anxiety disorder   ? ?Past Surgical History:  ?Procedure Laterality Date  ? ANKLE SURGERY Right   ? ?OB History   ?No obstetric history on file. ?  ? ?No Known Allergies ? ?ROS ?History obtained from the patient ?Pertinent items noted in HPI ? ?Objective ? ?BP (!) 143/85   Pulse 78   Ht 5\' 7"  (1.702 m)   Wt 209 lb 3.2 oz (94.9 kg)   LMP 05/29/2021 (Exact Date)   BMI 32.77 kg/m?  ? ?General appearance: alert, cooperative ?Pelvic: External genitalia normal, cervix normal in appearance, no CMT, uterus normal size, shape, and consistency, stool in rectum. Small amount of white, curd-like discharge with mild odor noted. Swab collected. ? ?Assessment ?1) Pelvic pain and dysmenorrhea -possible endometriosis ?2) Chronic constipation ? ?Plan ?1) Discussed options for managing pain and bleeding, including ibuprofen, IUD, and other non-estrogen contraceptives (elevated BP, vapes, age>40). Offered consult to MD for laparoscopy or other treatment methods. [name]  declines at this time and prefers to wait until after her GI evaluation. ?2) Recommended increased fluids and fiber, stool softener, Miralax, Smooth Moove tea. ? ?Return for annual visit or PRN ? ?Lloyd Huger, CNM ? ? ?  ?

## 2021-06-28 LAB — CERVICOVAGINAL ANCILLARY ONLY
Bacterial Vaginitis (gardnerella): NEGATIVE
Candida Glabrata: NEGATIVE
Candida Vaginitis: NEGATIVE
Comment: NEGATIVE
Comment: NEGATIVE
Comment: NEGATIVE

## 2021-06-30 ENCOUNTER — Encounter: Payer: Self-pay | Admitting: Obstetrics

## 2021-08-01 ENCOUNTER — Other Ambulatory Visit: Payer: Self-pay

## 2021-08-01 ENCOUNTER — Telehealth: Payer: Self-pay | Admitting: Pharmacy Technician

## 2021-08-01 ENCOUNTER — Ambulatory Visit: Payer: Medicaid Other | Admitting: Gerontology

## 2021-08-01 ENCOUNTER — Encounter: Payer: Self-pay | Admitting: Gerontology

## 2021-08-01 VITALS — BP 123/81 | HR 71 | Temp 98.1°F | Resp 16 | Ht 67.0 in | Wt 215.5 lb

## 2021-08-01 DIAGNOSIS — Z872 Personal history of diseases of the skin and subcutaneous tissue: Secondary | ICD-10-CM | POA: Insufficient documentation

## 2021-08-01 DIAGNOSIS — R21 Rash and other nonspecific skin eruption: Secondary | ICD-10-CM | POA: Insufficient documentation

## 2021-08-01 MED ORDER — TRIAMCINOLONE ACETONIDE 0.1 % EX CREA
1.0000 "application " | TOPICAL_CREAM | Freq: Two times a day (BID) | CUTANEOUS | 1 refills | Status: DC
  Filled 2021-08-01: qty 15, 8d supply, fill #0
  Filled 2021-09-15: qty 30, 15d supply, fill #0

## 2021-08-01 NOTE — Telephone Encounter (Signed)
Patient only signed DOH Attestation.  Would need to provide current year's household income if PAP medications were needed. ? ?Krishauna Schatzman J. Letanya Froh ?Patient Advocate Specialist ?[facility]  ?

## 2021-08-01 NOTE — Progress Notes (Signed)
? ?Established Patient Office Visit ? ?Subjective   ?Patient ID: Maria Sandoval, female    DOB: 02-Nov-1980  Age: 41 y.o. MRN: [id number] ? ?Chief Complaint  ?Patient presents with  ? Follow-up  ? Rash  ?  Patient has history of eczema and requesting medication refill  ? ? ?HPI ?Maria Sandoval  is 41 y/o female who has a history of anxiety and  presents for c/o ezcema flare up  to left frontal part of her head, above her eye brow and left fingers that erupted 2 weeks ago, but it's resolving with the application of Triamcinolone cream.She reports having a history of eczema wit occasional flare up. Overall, she states that she's doing well and offers no further complaint. ? ? ?Review of Systems  ?Constitutional: Negative.   ?Respiratory: Negative.    ?Cardiovascular: Negative.   ?Skin:   ?     Erythematous, scattered rash to left upper face and left fingers  ? ?  ?Objective:  ?  ? ?BP 123/81 (BP Location: Left Arm, Patient Position: Sitting, Cuff Size: Large)   Pulse 71   Temp 98.1 ?F (36.7 ?C) (Oral)   Resp 16   Ht 5\' 7"  (1.702 m)   Wt 215 lb 8 oz (97.8 kg)   LMP 07/29/2021 (Exact Date)   SpO2 98%   BMI 33.75 kg/m?  ?BP Readings from Last 3 Encounters:  ?08/01/21 123/81  ?06/22/21 (!) 143/85  ?06/20/21 135/85  ? ?Wt Readings from Last 3 Encounters:  ?08/01/21 215 lb 8 oz (97.8 kg)  ?06/22/21 209 lb 3.2 oz (94.9 kg)  ?06/20/21 208 lb 1.6 oz (94.4 kg)  ? ?  ? ?Physical Exam ?HENT:  ?   Head: Normocephalic and atraumatic.  ?Cardiovascular:  ?   Rate and Rhythm: Normal rate and regular rhythm.  ?   Pulses: Normal pulses.  ?   Heart sounds: Normal heart sounds.  ?Pulmonary:  ?   Effort: Pulmonary effort is normal.  ?   Breath sounds: Normal breath sounds.  ?Skin: ?   Findings: Rash (scattered pinkish erythematous rash to left frontal aspect of the head.) present.  ?Neurological:  ?   General: No focal deficit present.  ?   Mental Status: She is alert.  ? ? ? ?No results found for any visits on  08/01/21. ? ?Last CBC ?Lab Results  ?Component Value Date  ? WBC 8.9 10/25/2015  ? HGB 14.8 10/25/2015  ? HCT 41.7 10/25/2015  ? MCV 87.5 10/25/2015  ? MCH 31.1 10/25/2015  ? RDW 13.1 10/25/2015  ? PLT 269 10/25/2015  ? ?Last metabolic panel ?Lab Results  ?Component Value Date  ? GLUCOSE 76 10/25/2015  ? NA 144 10/25/2015  ? K 3.0 (L) 10/25/2015  ? CL 109 10/25/2015  ? CO2 31 10/25/2015  ? BUN 13 10/25/2015  ? CREATININE 0.64 10/25/2015  ? GFRNONAA >60 10/25/2015  ? CALCIUM 9.4 10/25/2015  ? PROT 7.7 10/25/2015  ? ALBUMIN 4.6 10/25/2015  ? BILITOT 0.4 10/25/2015  ? ALKPHOS 63 10/25/2015  ? AST 21 10/25/2015  ? ALT 18 10/25/2015  ? ANIONGAP 4 (L) 10/25/2015  ? ?Last lipids ?Lab Results  ?Component Value Date  ? CHOL 190 10/27/2015  ? HDL 57 10/27/2015  ? LDLCALC 114 (H) 10/27/2015  ? TRIG 93 10/27/2015  ? CHOLHDL 3.3 10/27/2015  ? ?Last hemoglobin A1c ?Lab Results  ?Component Value Date  ? HGBA1C 5.1 10/27/2015  ? ?Last thyroid functions ?Lab Results  ?Component Value Date  ?  TSH 0.933 10/27/2015  ? ?  ? ?The ASCVD Risk score (Arnett DK, et al., 2019) failed to calculate for the following reasons: ?  Cannot find a previous HDL lab ?  Cannot find a previous total cholesterol lab ? ?  ?Assessment & Plan:  ? ?1. History of eczema ?- She will continue on current medication, was advised to notify clinic for worsening symptoms and possible Dermatology referral. ?- triamcinolone cream (KENALOG) 0.1 %; Apply 1 application to the affected area(s) topically 2 (two) times daily.  Dispense: 30 g; Refill: 1 ? ?2. Rash ?-She will continue on current medication, was advised to notify clinic for worsening symptoms and possible Dermatology referral. ?- triamcinolone cream (KENALOG) 0.1 %; Apply 1 application to the affected area(s) topically 2 (two) times daily.  Dispense: 30 g; Refill: 1 ? ? ?Return in about 30 days (around 08/31/2021), or if symptoms worsen or fail to improve.  ? ? ?Alexiah Koroma Trellis Paganini, NP ? ?

## 2021-08-31 ENCOUNTER — Ambulatory Visit: Payer: Medicaid Other | Admitting: Gerontology

## 2021-09-15 ENCOUNTER — Other Ambulatory Visit: Payer: Self-pay

## 2021-10-17 ENCOUNTER — Ambulatory Visit
Admission: RE | Admit: 2021-10-17 | Discharge: 2021-10-17 | Disposition: A | Discharge status: Home / Self Care | Payer: Self-pay | Source: Ambulatory Visit | Attending: Gastroenterology | Admitting: Gastroenterology

## 2021-10-17 ENCOUNTER — Encounter: Payer: Self-pay | Admitting: Gastroenterology

## 2021-10-17 ENCOUNTER — Ambulatory Visit (INDEPENDENT_AMBULATORY_CARE_PROVIDER_SITE_OTHER): Payer: Medicaid Other | Admitting: Gastroenterology

## 2021-10-17 VITALS — BP 129/75 | HR 78 | Temp 99.2°F | Ht 67.0 in | Wt 216.5 lb

## 2021-10-17 DIAGNOSIS — R140 Abdominal distension (gaseous): Secondary | ICD-10-CM

## 2021-10-17 DIAGNOSIS — K5909 Other constipation: Secondary | ICD-10-CM | POA: Insufficient documentation

## 2021-10-17 NOTE — Patient Instructions (Signed)
Go over the medical mall for Xray  High-Fiber Eating Plan Fiber, also called dietary fiber, is a type of carbohydrate. It is found foods such as fruits, vegetables, whole grains, and beans. A high-fiber diet can have many health benefits. Your health care provider may recommend a high-fiber diet to help: Prevent constipation. Fiber can make your bowel movements more regular. Lower your cholesterol. Relieve the following conditions: Inflammation of veins in the anus (hemorrhoids). Inflammation of specific areas of the digestive tract (uncomplicated diverticulosis). A problem of the large intestine, also called the colon, that sometimes causes pain and diarrhea (irritable bowel syndrome, or IBS). Prevent overeating as part of a weight-loss plan. Prevent heart disease, type 2 diabetes, and certain cancers. What are tips for following this plan? Reading food labels  Check the nutrition facts label on food products for the amount of dietary fiber. Choose foods that have 5 grams of fiber or more per serving. The goals for recommended daily fiber intake include: Men (age 77 or younger): 34-38 g. Men (over age 31): 28-34 g. Women (age 52 or younger): 25-28 g. Women (over age 48): 22-25 g. Your daily fiber goal is _____________ g. Shopping Choose whole fruits and vegetables instead of processed forms, such as apple juice or applesauce. Choose a wide variety of high-fiber foods such as avocados, lentils, oats, and kidney beans. Read the nutrition facts label of the foods you choose. Be aware of foods with added fiber. These foods often have high sugar and sodium amounts per serving. Cooking Use whole-grain flour for baking and cooking. Cook with brown rice instead of white rice. Meal planning Start the day with a breakfast that is high in fiber, such as a cereal that contains 5 g of fiber or more per serving. Eat breads and cereals that are made with whole-grain flour instead of refined flour or  white flour. Eat brown rice, bulgur wheat, or millet instead of white rice. Use beans in place of meat in soups, salads, and pasta dishes. Be sure that half of the grains you eat each day are whole grains. General information You can get the recommended daily intake of dietary fiber by: Eating a variety of fruits, vegetables, grains, nuts, and beans. Taking a fiber supplement if you are not able to take in enough fiber in your diet. It is better to get fiber through food than from a supplement. Gradually increase how much fiber you consume. If you increase your intake of dietary fiber too quickly, you may have bloating, cramping, or gas. Drink plenty of water to help you digest fiber. Choose high-fiber snacks, such as berries, raw vegetables, nuts, and popcorn. What foods should I eat? Fruits Berries. Pears. Apples. Oranges. Avocado. Prunes and raisins. Dried figs. Vegetables Sweet potatoes. Spinach. Kale. Artichokes. Cabbage. Broccoli. Cauliflower. Green peas. Carrots. Squash. Grains Whole-grain breads. Multigrain cereal. Oats and oatmeal. Brown rice. Barley. Bulgur wheat. Millet. Quinoa. Bran muffins. Popcorn. Rye wafer crackers. Meats and other proteins Navy beans, kidney beans, and pinto beans. Soybeans. Split peas. Lentils. Nuts and seeds. Dairy Fiber-fortified yogurt. Beverages Fiber-fortified soy milk. Fiber-fortified orange juice. Other foods Fiber bars. The items listed above may not be a complete list of recommended foods and beverages. Contact a dietitian for more information. What foods should I avoid? Fruits Fruit juice. Cooked, strained fruit. Vegetables Fried potatoes. Canned vegetables. Well-cooked vegetables. Grains White bread. Pasta made with refined flour. White rice. Meats and other proteins Fatty cuts of meat. Fried chicken or fried fish. Dairy  Milk. Yogurt. Cream cheese. Sour cream. Fats and oils Butters. Beverages Soft drinks. Other foods Cakes and  pastries. The items listed above may not be a complete list of foods and beverages to avoid. Talk with your dietitian about what choices are best for you. Summary Fiber is a type of carbohydrate. It is found in foods such as fruits, vegetables, whole grains, and beans. A high-fiber diet has many benefits. It can help to prevent constipation, lower blood cholesterol, aid weight loss, and reduce your risk of heart disease, diabetes, and certain cancers. Increase your intake of fiber gradually. Increasing fiber too quickly may cause cramping, bloating, and gas. Drink plenty of water while you increase the amount of fiber you consume. The best sources of fiber include whole fruits and vegetables, whole grains, nuts, seeds, and beans. This information is not intended to replace advice given to you by your health care provider. Make sure you discuss any questions you have with your health care provider. Document Revised: 07/09/2019 Document Reviewed: 07/09/2019 Elsevier Patient Education  2023 [employer].

## 2021-10-17 NOTE — Progress Notes (Signed)
Arlyss Repress, MD [address]  [address]  [place], [place] [address]  Main: [phone]  Fax: [phone]    Gastroenterology Consultation  Referring Provider:     Rolm Gala, NP Primary Care Physician:  Rolm Gala, NP Primary Gastroenterologist:  Dr. Arlyss Repress Reason for Consultation: Abdominal bloating, irregular bowel habits        HPI:   Maria Sandoval is a 41 y.o. female referred by Dr. Rolm Gala, NP  for consultation & management of chronic symptoms of abdominal bloating and irregular bowel habits.  Patient reports that in the last few months, she has been experiencing worsening of GI symptoms 2 to 3 days before her menstrual cycles.  Her symptoms predominantly include lower abdominal discomfort, feels pressure,/heavy/gassy feels like having a bowel movement, stools are hard.  Her OB/GYN offered IUD but patient deferred.  Apparently, patient reports that she feels bloated generally, has bowel movements every other day and hard, lumpy.  She does report pain in left flank area.  She went to the ER on 05/22/2021, had CT abdomen pelvis with contrast which revealed colonic diverticulosis only.  CBC, CMP, serum lipase were normal she reports that she was taking stool softener for 2 months which resulted in regular bowel movements and she stopped because of the concern for long-term use.  She was told that she may need upper endoscopy and colonoscopy for further evaluation.  She is currently not on any stool softeners.  Patient denies any rectal bleeding.  NSAIDs: None  Antiplts/Anticoagulants/Anti thrombotics: None  GI Procedures: None  Past Medical History:  Diagnosis Date   Anxiety disorder     Past Surgical History:  Procedure Laterality Date   ANKLE SURGERY Right      Current Outpatient Medications:    Boswellia-Glucosamine-Vit D (OSTEO BI-FLEX ONE PER DAY PO), Take 1 tablet by mouth daily., Disp: , Rfl:    Multiple Vitamin  (MULTIVITAMIN) tablet, Take 1 tablet by mouth daily., Disp: , Rfl:    Probiotic Product (PROBIOTIC DAILY PO), Take 1 tablet by mouth daily., Disp: , Rfl:    triamcinolone cream (KENALOG) 0.1 %, Apply 1 application to the affected area(s) topically 2 (two) times daily., Disp: 30 g, Rfl: 1   Family History  Problem Relation Age of Onset   Hypertension Mother    Diabetes Mother    Anxiety disorder Mother    Depression Mother    Healthy Father    Seizures Sister    Diabetes Maternal Grandmother    Hypertension Maternal Grandmother    Heart attack Maternal Grandfather    Other Paternal Grandmother        unknown medical history   Other Paternal Grandfather        unknown medical hsitory     Social History   Tobacco Use   Smoking status: Former    Types: Cigarettes    Quit date: 2019    Years since quitting: 4.5   Smokeless tobacco: Never  Vaping Use   Vaping Use: Every day   Substances: Nicotine, Flavoring  Substance Use Topics   Alcohol use: Not Currently    Comment: last use ~ 2019   Drug use: Not Currently    Types: Cocaine, Marijuana    Comment: last use ~2021    Allergies as of 10/17/2021   (No Known Allergies)    Review of Systems:    All systems reviewed and negative except where noted in HPI.  Physical Exam:  BP 129/75 (BP Location: Left Arm, Patient Position: Sitting, Cuff Size: Normal)   Pulse 78   Temp 99.2 F (37.3 C) (Oral)   Ht 5\' 7"  (1.702 m)   Wt 216 lb 8 oz (98.2 kg)   BMI 33.91 kg/m  No LMP recorded.  General:   Alert,  Well-developed, well-nourished, pleasant and cooperative in NAD Head:  Normocephalic and atraumatic. Eyes:  Sclera clear, no icterus.   Conjunctiva pink. Ears:  Normal auditory acuity. Nose:  No deformity, discharge, or lesions. Mouth:  No deformity or lesions,oropharynx pink & moist. Neck:  Supple; no masses or thyromegaly. Lungs:  Respirations even and unlabored.  Clear throughout to auscultation.   No wheezes,  crackles, or rhonchi. No acute distress. Heart:  Regular rate and rhythm; no murmurs, clicks, rubs, or gallops. Abdomen:  Normal bowel sounds. Soft, non-tender and diffusely distended, tympanic without masses, hepatosplenomegaly or hernias noted.  No guarding or rebound tenderness.   Rectal: Not performed Msk:  Symmetrical without gross deformities. Good, equal movement & strength bilaterally. Pulses:  Normal pulses noted. Extremities:  No clubbing or edema.  No cyanosis. Neurologic:  Alert and oriented x3;  grossly normal neurologically. Skin:  Intact without significant lesions or rashes. No jaundice. Psych:  Alert and cooperative. Normal mood and affect.  Imaging Studies: Reviewed  Assessment and Plan:   Maria Sandoval is a 41 y.o. pleasant Caucasian female with chronic symptoms of abdominal bloating, irregular bowel habits, worse before menstrual cycle.  Advised patient regarding high-fiber diet, information provided.  Advised her to start taking MiraLAX daily.  Recommend x-ray KUB.  Based on KUB findings, will recommend bowel cleanout I do not recommend colonoscopy at this time  Follow up as needed   46, MD

## 2021-10-18 ENCOUNTER — Telehealth: Payer: Self-pay

## 2021-10-18 ENCOUNTER — Telehealth: Payer: Self-pay | Admitting: Emergency Medicine

## 2021-10-18 NOTE — Telephone Encounter (Signed)
Called patient to schedule appointment. Patient refused to schedule at this time.

## 2021-10-18 NOTE — Telephone Encounter (Signed)
Patient verbalized understanding of results  

## 2021-10-18 NOTE — Telephone Encounter (Signed)
-----   Message from Toney Reil, MD sent at 10/17/2021  6:08 PM EDT ----- X-ray of her abdomen showed mild fecal loading in the right colon.  Recommend to take MiraLAX 34 g daily and cut back to 17 g daily if she is having too many loose stools  RV

## 2021-10-18 NOTE — Telephone Encounter (Signed)
-----   Message from Rolm Gala, NP sent at 10/18/2021  9:40 AM EDT ----- Pls schedule in clinic appointment for [facility], and make telephone note. Thank you

## 2022-02-16 DIAGNOSIS — Z419 Encounter for procedure for purposes other than remedying health state, unspecified: Secondary | ICD-10-CM | POA: Diagnosis not present

## 2022-03-19 DIAGNOSIS — Z419 Encounter for procedure for purposes other than remedying health state, unspecified: Secondary | ICD-10-CM | POA: Diagnosis not present

## 2022-03-26 ENCOUNTER — Other Ambulatory Visit: Payer: Self-pay

## 2022-04-19 DIAGNOSIS — Z419 Encounter for procedure for purposes other than remedying health state, unspecified: Secondary | ICD-10-CM | POA: Diagnosis not present

## 2022-05-18 DIAGNOSIS — Z419 Encounter for procedure for purposes other than remedying health state, unspecified: Secondary | ICD-10-CM | POA: Diagnosis not present

## 2022-06-18 DIAGNOSIS — Z419 Encounter for procedure for purposes other than remedying health state, unspecified: Secondary | ICD-10-CM | POA: Diagnosis not present

## 2022-07-18 DIAGNOSIS — Z419 Encounter for procedure for purposes other than remedying health state, unspecified: Secondary | ICD-10-CM | POA: Diagnosis not present

## 2022-08-18 DIAGNOSIS — Z419 Encounter for procedure for purposes other than remedying health state, unspecified: Secondary | ICD-10-CM | POA: Diagnosis not present

## 2022-09-17 DIAGNOSIS — Z419 Encounter for procedure for purposes other than remedying health state, unspecified: Secondary | ICD-10-CM | POA: Diagnosis not present

## 2022-10-15 ENCOUNTER — Encounter: Payer: Self-pay | Admitting: Obstetrics and Gynecology

## 2022-10-15 ENCOUNTER — Other Ambulatory Visit (HOSPITAL_COMMUNITY)
Admission: RE | Admit: 2022-10-15 | Discharge: 2022-10-15 | Disposition: A | Discharge status: Home / Self Care | Payer: Medicaid Other | Source: Ambulatory Visit | Attending: Obstetrics and Gynecology | Admitting: Obstetrics and Gynecology

## 2022-10-15 ENCOUNTER — Ambulatory Visit (INDEPENDENT_AMBULATORY_CARE_PROVIDER_SITE_OTHER): Payer: Medicaid Other | Admitting: Obstetrics and Gynecology

## 2022-10-15 VITALS — BP 124/90 | Ht 67.0 in | Wt 215.0 lb

## 2022-10-15 DIAGNOSIS — Z1151 Encounter for screening for human papillomavirus (HPV): Secondary | ICD-10-CM | POA: Insufficient documentation

## 2022-10-15 DIAGNOSIS — Z124 Encounter for screening for malignant neoplasm of cervix: Secondary | ICD-10-CM

## 2022-10-15 DIAGNOSIS — Z01419 Encounter for gynecological examination (general) (routine) without abnormal findings: Secondary | ICD-10-CM | POA: Diagnosis not present

## 2022-10-15 DIAGNOSIS — Z1231 Encounter for screening mammogram for malignant neoplasm of breast: Secondary | ICD-10-CM

## 2022-10-15 NOTE — Patient Instructions (Addendum)
I value your feedback and you entrusting us with your care. If you get a [facility] patient survey, I would appreciate you taking the time to let us know about your experience today. Thank you!  [facility] ([place])--[phone]  

## 2022-10-15 NOTE — Progress Notes (Signed)
PCP:  Rolm Gala, NP   Chief Complaint  Patient presents with   Gynecologic Exam    No concerns     HPI:      Maria Sandoval is a 42 y.o. G3P0030 whose LMP was Patient's last menstrual period was 09/24/2022 (approximate)., presents today for her annual examination.  Her menses are regular every 28-30 days, lasting 3-5 days, mod flow, no BTB, mild dysmen, improved with NSAIDs.    Sex activity: not sexually active. No vag sx.  Last Pap: a few yrs ago at [facility]; no hx of abn paps  Last mammogram: never There is no FH of breast cancer. There is no FH of ovarian cancer. The patient does not do self-breast exams.  Tobacco use: vapes daily Alcohol use: none No drug use.  Exercise: not active  She does get adequate calcium and some Vitamin D in her diet. Has upcoming PCP appt.  Patient Active Problem List   Diagnosis Date Noted   History of eczema 08/01/2021   Rash 08/01/2021   Encounter to establish care 06/20/2021   Abdominal pain 06/20/2021   Dysmenorrhea 10/23/2017   Severe recurrent major depression without psychotic features (HCC) 10/26/2015   Overdose 10/26/2015   Alcohol use disorder, mild, abuse 10/26/2015   Involuntary commitment 10/26/2015   Tobacco use disorder 10/26/2015    Past Surgical History:  Procedure Laterality Date   ANKLE SURGERY Right     Family History  Problem Relation Age of Onset   Hypertension Mother    Diabetes Mother    Anxiety disorder Mother    Depression Mother    Healthy Father    Seizures Sister    Colon cancer Paternal Uncle        late years   Diabetes Maternal Grandmother    Hypertension Maternal Grandmother    Heart attack Maternal Grandfather    Other Paternal Grandmother        unknown medical history   Other Paternal Grandfather        unknown medical hsitory    Social History   Socioeconomic History   Marital status: Single    Spouse name: Not on file   Number of children: Not on file   Years  of education: Not on file   Highest education level: Not on file  Occupational History   Not on file  Tobacco Use   Smoking status: Former    Current packs/day: 0.00    Types: Cigarettes    Quit date: 2019    Years since quitting: 5.5   Smokeless tobacco: Never  Vaping Use   Vaping status: Every Day   Substances: Nicotine, Flavoring  Substance and Sexual Activity   Alcohol use: Not Currently    Comment: last use ~ 2019   Drug use: Not Currently    Types: Cocaine, Marijuana    Comment: last use ~2021   Sexual activity: Not Currently    Partners: Male    Birth control/protection: None  Other Topics Concern   Not on file  Social History Narrative   Not on file   Social Determinants of Health   Financial Resource Strain: Not on file  Food Insecurity: Food Insecurity Present (06/20/2021)   Hunger Vital Sign    Worried About Running Out of Food in the Last Year: Sometimes true    Ran Out of Food in the Last Year: Sometimes true  Transportation Needs: No Transportation Needs (06/20/2021)   PRAPARE - Transportation  Lack of Transportation (Medical): No    Lack of Transportation (Non-Medical): No  Physical Activity: Not on file  Stress: Not on file  Social Connections: Not on file  Intimate Partner Violence: Not on file     Current Outpatient Medications:    Boswellia-Glucosamine-Vit D (OSTEO BI-FLEX ONE PER DAY PO), Take 1 tablet by mouth daily., Disp: , Rfl:    Multiple Vitamin (MULTIVITAMIN) tablet, Take 1 tablet by mouth daily., Disp: , Rfl:    Probiotic Product (PROBIOTIC DAILY PO), Take 1 tablet by mouth daily., Disp: , Rfl:    triamcinolone cream (KENALOG) 0.1 %, Apply 1 application to the affected area(s) topically 2 (two) times daily., Disp: 30 g, Rfl: 1     ROS:  Review of Systems  Constitutional:  Negative for fatigue, fever and unexpected weight change.  Respiratory:  Negative for cough, shortness of breath and wheezing.   Cardiovascular:  Negative for  chest pain, palpitations and leg swelling.  Gastrointestinal:  Negative for blood in stool, constipation, diarrhea, nausea and vomiting.  Endocrine: Negative for cold intolerance, heat intolerance and polyuria.  Genitourinary:  Negative for dyspareunia, dysuria, flank pain, frequency, genital sores, hematuria, menstrual problem, pelvic pain, urgency, vaginal bleeding, vaginal discharge and vaginal pain.  Musculoskeletal:  Negative for back pain, joint swelling and myalgias.  Skin:  Negative for rash.  Neurological:  Negative for dizziness, syncope, light-headedness, numbness and headaches.  Hematological:  Negative for adenopathy.  Psychiatric/Behavioral:  Negative for agitation, confusion, sleep disturbance and suicidal ideas. The patient is not nervous/anxious.    BREAST: No symptoms   Objective: BP (!) 124/90   Ht 5\' 7"  (1.702 m)   Wt 215 lb (97.5 kg)   LMP 09/24/2022 (Approximate)   BMI 33.67 kg/m    Physical Exam Constitutional:      Appearance: She is well-developed.  Genitourinary:     Vulva normal.     Right Labia: No rash, tenderness or lesions.    Left Labia: No tenderness, lesions or rash.    No vaginal discharge, erythema or tenderness.      Right Adnexa: not tender and no mass present.    Left Adnexa: not tender and no mass present.    No cervical friability or polyp.     Uterus is not enlarged or tender.  Breasts:    Right: No mass, nipple discharge, skin change or tenderness.     Left: No mass, nipple discharge, skin change or tenderness.  Neck:     Thyroid: No thyromegaly.  Cardiovascular:     Rate and Rhythm: Normal rate and regular rhythm.     Heart sounds: Normal heart sounds. No murmur heard. Pulmonary:     Effort: Pulmonary effort is normal.     Breath sounds: Normal breath sounds.  Abdominal:     Palpations: Abdomen is soft.     Tenderness: There is no abdominal tenderness. There is no guarding or rebound.  Musculoskeletal:        General:  Normal range of motion.     Cervical back: Normal range of motion.  Lymphadenopathy:     Cervical: No cervical adenopathy.  Neurological:     General: No focal deficit present.     Mental Status: She is alert and oriented to person, place, and time.     Cranial Nerves: No cranial nerve deficit.  Skin:    General: Skin is warm and dry.  Psychiatric:        Mood and Affect: Mood normal.  Behavior: Behavior normal.        Thought Content: Thought content normal.        Judgment: Judgment normal.  Vitals reviewed.    Assessment/Plan: Encounter for annual routine gynecological examination  Cervical cancer screening - Plan: Cytology - PAP  Screening for HPV (human papillomavirus) - Plan: Cytology - PAP  Encounter for screening mammogram for malignant neoplasm of breast - Plan: MM 3D SCREENING MAMMOGRAM BILATERAL BREAST; pt to schedule mammo            GYN counsel breast self exam, mammography screening, adequate intake of calcium and vitamin D, diet and exercise     F/U  Return in about 1 year (around 10/15/2023).  Gaile Allmon B. Jolea Dolle, PA-C 10/15/2022 2:13 PM

## 2022-10-18 DIAGNOSIS — Z419 Encounter for procedure for purposes other than remedying health state, unspecified: Secondary | ICD-10-CM | POA: Diagnosis not present

## 2022-10-31 ENCOUNTER — Ambulatory Visit
Admission: RE | Admit: 2022-10-31 | Discharge: 2022-10-31 | Disposition: A | Discharge status: Home / Self Care | Payer: Medicaid Other | Source: Ambulatory Visit | Attending: Obstetrics and Gynecology | Admitting: Obstetrics and Gynecology

## 2022-10-31 DIAGNOSIS — Z1231 Encounter for screening mammogram for malignant neoplasm of breast: Secondary | ICD-10-CM | POA: Insufficient documentation

## 2022-11-02 ENCOUNTER — Other Ambulatory Visit: Payer: Self-pay | Admitting: Obstetrics and Gynecology

## 2022-11-02 DIAGNOSIS — Z1231 Encounter for screening mammogram for malignant neoplasm of breast: Secondary | ICD-10-CM

## 2022-11-02 DIAGNOSIS — R928 Other abnormal and inconclusive findings on diagnostic imaging of breast: Secondary | ICD-10-CM

## 2022-11-02 DIAGNOSIS — R921 Mammographic calcification found on diagnostic imaging of breast: Secondary | ICD-10-CM

## 2022-11-14 ENCOUNTER — Ambulatory Visit
Admission: RE | Admit: 2022-11-14 | Discharge: 2022-11-14 | Disposition: A | Discharge status: Home / Self Care | Payer: Medicaid Other | Source: Ambulatory Visit | Attending: Obstetrics and Gynecology | Admitting: Obstetrics and Gynecology

## 2022-11-14 ENCOUNTER — Encounter: Payer: Self-pay | Admitting: Obstetrics and Gynecology

## 2022-11-14 DIAGNOSIS — R921 Mammographic calcification found on diagnostic imaging of breast: Secondary | ICD-10-CM

## 2022-11-14 DIAGNOSIS — R928 Other abnormal and inconclusive findings on diagnostic imaging of breast: Secondary | ICD-10-CM | POA: Insufficient documentation

## 2022-11-14 DIAGNOSIS — R92321 Mammographic fibroglandular density, right breast: Secondary | ICD-10-CM | POA: Diagnosis not present

## 2022-11-18 DIAGNOSIS — Z419 Encounter for procedure for purposes other than remedying health state, unspecified: Secondary | ICD-10-CM | POA: Diagnosis not present

## 2022-12-18 DIAGNOSIS — Z419 Encounter for procedure for purposes other than remedying health state, unspecified: Secondary | ICD-10-CM | POA: Diagnosis not present

## 2023-01-09 DIAGNOSIS — M722 Plantar fascial fibromatosis: Secondary | ICD-10-CM | POA: Diagnosis not present

## 2023-01-09 DIAGNOSIS — M79672 Pain in left foot: Secondary | ICD-10-CM | POA: Diagnosis not present

## 2023-01-18 DIAGNOSIS — Z419 Encounter for procedure for purposes other than remedying health state, unspecified: Secondary | ICD-10-CM | POA: Diagnosis not present

## 2023-02-17 DIAGNOSIS — Z419 Encounter for procedure for purposes other than remedying health state, unspecified: Secondary | ICD-10-CM | POA: Diagnosis not present

## 2023-03-18 ENCOUNTER — Ambulatory Visit (INDEPENDENT_AMBULATORY_CARE_PROVIDER_SITE_OTHER): Payer: Medicaid Other | Admitting: Internal Medicine

## 2023-03-18 ENCOUNTER — Encounter: Payer: Self-pay | Admitting: Internal Medicine

## 2023-03-18 VITALS — BP 128/74 | Ht 67.0 in | Wt 208.8 lb

## 2023-03-18 DIAGNOSIS — F10920 Alcohol use, unspecified with intoxication, uncomplicated: Secondary | ICD-10-CM | POA: Insufficient documentation

## 2023-03-18 DIAGNOSIS — E6609 Other obesity due to excess calories: Secondary | ICD-10-CM | POA: Diagnosis not present

## 2023-03-18 DIAGNOSIS — R739 Hyperglycemia, unspecified: Secondary | ICD-10-CM | POA: Diagnosis not present

## 2023-03-18 DIAGNOSIS — F32A Depression, unspecified: Secondary | ICD-10-CM | POA: Diagnosis not present

## 2023-03-18 DIAGNOSIS — M722 Plantar fascial fibromatosis: Secondary | ICD-10-CM | POA: Diagnosis not present

## 2023-03-18 DIAGNOSIS — Z6832 Body mass index (BMI) 32.0-32.9, adult: Secondary | ICD-10-CM | POA: Diagnosis not present

## 2023-03-18 DIAGNOSIS — E7800 Pure hypercholesterolemia, unspecified: Secondary | ICD-10-CM

## 2023-03-18 DIAGNOSIS — E66811 Obesity, class 1: Secondary | ICD-10-CM

## 2023-03-18 DIAGNOSIS — L2082 Flexural eczema: Secondary | ICD-10-CM | POA: Diagnosis not present

## 2023-03-18 DIAGNOSIS — F419 Anxiety disorder, unspecified: Secondary | ICD-10-CM | POA: Insufficient documentation

## 2023-03-18 DIAGNOSIS — F411 Generalized anxiety disorder: Secondary | ICD-10-CM | POA: Insufficient documentation

## 2023-03-18 DIAGNOSIS — L309 Dermatitis, unspecified: Secondary | ICD-10-CM | POA: Insufficient documentation

## 2023-03-18 NOTE — Patient Instructions (Signed)

## 2023-03-18 NOTE — Assessment & Plan Note (Signed)
Will check lipid profile at annual exam Encouraged low fat diet 

## 2023-03-18 NOTE — Progress Notes (Signed)
Subjective:    Patient ID: Maria Sandoval, female    DOB: [birth year], 42 y.o.   MRN: [id number]  HPI  Patient presents to clinic today to establish care and for management of the conditions listed below.  HLD: Her last LDL was 114, triglycerides 93, 2017.  She is not taking any cholesterol-lowering medication at this time.  Eczema: Managed with triamcinolone cream.  She does not follow with dermatology.  Anxiety and depression: Chronic. She is not currently taking any medication for this. She is seeing a therapist. She denies SI/HI.  Plantar fasciitis: She is not taking any medications for this. She would like a referral to podiatry for further evaluation.  Review of Systems   Past Medical History:  Diagnosis Date   Anxiety disorder     Current Outpatient Medications  Medication Sig Dispense Refill   Boswellia-Glucosamine-Vit D (OSTEO BI-FLEX ONE PER DAY PO) Take 1 tablet by mouth daily.     Multiple Vitamin (MULTIVITAMIN) tablet Take 1 tablet by mouth daily.     Probiotic Product (PROBIOTIC DAILY PO) Take 1 tablet by mouth daily.     triamcinolone cream (KENALOG) 0.1 % Apply 1 application to the affected area(s) topically 2 (two) times daily. 30 g 1   No current facility-administered medications for this visit.    No Known Allergies  Family History  Problem Relation Age of Onset   Hypertension Mother    Diabetes Mother    Anxiety disorder Mother    Depression Mother    Healthy Father    Seizures Sister    Colon cancer Paternal Uncle        late years   Diabetes Maternal Grandmother    Hypertension Maternal Grandmother    Heart attack Maternal Grandfather    Other Paternal Grandmother        unknown medical history   Other Paternal Grandfather        unknown medical hsitory   Breast cancer Neg Hx     Social History   Socioeconomic History   Marital status: Single    Spouse name: Not on file   Number of children: Not on file   Years of education: Not  on file   Highest education level: Not on file  Occupational History   Not on file  Tobacco Use   Smoking status: Former    Current packs/day: 0.00    Types: Cigarettes    Quit date: 2019    Years since quitting: 6.0   Smokeless tobacco: Never  Vaping Use   Vaping status: Every Day   Substances: Nicotine, Flavoring  Substance and Sexual Activity   Alcohol use: Not Currently    Comment: last use ~ 2019   Drug use: Not Currently    Types: Cocaine, Marijuana    Comment: last use ~2021   Sexual activity: Not Currently    Partners: Male    Birth control/protection: None  Other Topics Concern   Not on file  Social History Narrative   Not on file   Social Drivers of Health   Financial Resource Strain: Not on file  Food Insecurity: Food Insecurity Present (06/20/2021)   Hunger Vital Sign    Worried About Running Out of Food in the Last Year: Sometimes true    Ran Out of Food in the Last Year: Sometimes true  Transportation Needs: No Transportation Needs (06/20/2021)   PRAPARE - Administrator, Civil Service (Medical): No    Lack of Transportation (  Non-Medical): No  Physical Activity: Not on file  Stress: Not on file  Social Connections: Not on file  Intimate Partner Violence: Not on file     Constitutional: Denies fever, malaise, fatigue, headache or abrupt weight changes.  HEENT: Denies eye pain, eye redness, ear pain, ringing in the ears, wax buildup, runny nose, nasal congestion, bloody nose, or sore throat. Respiratory: Denies difficulty breathing, shortness of breath, cough or sputum production.   Cardiovascular: Denies chest pain, chest tightness, palpitations or swelling in the hands or feet.  Gastrointestinal: Denies abdominal pain, bloating, constipation, diarrhea or blood in the stool.  GU: Denies urgency, frequency, pain with urination, burning sensation, blood in urine, odor or discharge. Musculoskeletal: Pt reports left heel pain. Denies decrease in  range of motion, difficulty with gait, muscle pain or joint swelling.  Skin: Denies redness, rashes, lesions or ulcercations.  Neurological: Denies dizziness, difficulty with memory, difficulty with speech or problems with balance and coordination.  Psych: Denies anxiety, depression, SI/HI.  No other specific complaints in a complete review of systems (except as listed in HPI above).      Objective:   Physical Exam  There were no vitals taken for this visit. Wt Readings from Last 3 Encounters:  10/15/22 215 lb (97.5 kg)  10/17/21 216 lb 8 oz (98.2 kg)  08/01/21 215 lb 8 oz (97.8 kg)    General: Appears their stated age, well developed, well nourished in NAD. Skin: Warm, dry and intact. No rashes, lesions or ulcerations noted. HEENT: Head: normal shape and size; Eyes: sclera white, no icterus, conjunctiva pink, PERRLA and EOMs intact; Ears: Tm's gray and intact, normal light reflex; Nose: mucosa pink and moist, septum midline; Throat/Mouth: Teeth present, mucosa pink and moist, no exudate, lesions or ulcerations noted.  Neck:  Neck supple, trachea midline. No masses, lumps or thyromegaly present.  Cardiovascular: Normal rate and rhythm. S1,S2 noted.  No murmur, rubs or gallops noted. No JVD or BLE edema. No carotid bruits noted. Pulmonary/Chest: Normal effort and positive vesicular breath sounds. No respiratory distress. No wheezes, rales or ronchi noted.  Abdomen: Soft and nontender. Normal bowel sounds. No distention or masses noted. Liver, spleen and kidneys non palpable. Musculoskeletal: Normal range of motion. No signs of joint swelling. No difficulty with gait.  Neurological: Alert and oriented. Cranial nerves II-XII grossly intact. Coordination normal.  Psychiatric: Mood and affect normal. Behavior is normal. Judgment and thought content normal.    BMET    Component Value Date/Time   NA 144 10/25/2015 2154   K 3.0 (L) 10/25/2015 2154   CL 109 10/25/2015 2154   CO2 31  10/25/2015 2154   GLUCOSE 76 10/25/2015 2154   BUN 13 10/25/2015 2154   CREATININE 0.64 10/25/2015 2154   CALCIUM 9.4 10/25/2015 2154   GFRNONAA >60 10/25/2015 2154   GFRAA >60 10/25/2015 2154    Lipid Panel     Component Value Date/Time   CHOL 190 10/27/2015 0818   TRIG 93 10/27/2015 0818   HDL 57 10/27/2015 0818   CHOLHDL 3.3 10/27/2015 0818   VLDL 19 10/27/2015 0818   LDLCALC 114 (H) 10/27/2015 0818    CBC    Component Value Date/Time   WBC 8.9 10/25/2015 2154   RBC 4.76 10/25/2015 2154   HGB 14.8 10/25/2015 2154   HCT 41.7 10/25/2015 2154   PLT 269 10/25/2015 2154   MCV 87.5 10/25/2015 2154   MCH 31.1 10/25/2015 2154   MCHC 35.5 10/25/2015 2154   RDW  13.1 10/25/2015 2154   LYMPHSABS 2.5 10/25/2015 2154   MONOABS 0.6 10/25/2015 2154   EOSABS 0.1 10/25/2015 2154   BASOSABS 0.0 10/25/2015 2154    Hgb A1C Lab Results  Component Value Date   HGBA1C 5.1 10/27/2015            Assessment & Plan:    RTC in 6 months for your annual exam Nicki Reaper, NP

## 2023-03-18 NOTE — Assessment & Plan Note (Signed)
Continue triamcinolone as needed.

## 2023-03-18 NOTE — Assessment & Plan Note (Signed)
Not medicated She will continue to meet with her therapist Support offered

## 2023-03-18 NOTE — Assessment & Plan Note (Signed)
Not having daily pain so we will hold off on meloxicam at this time Referral to podiatry placed per her request

## 2023-03-18 NOTE — Assessment & Plan Note (Signed)
Encouraged diet and exercise for weight loss ?

## 2023-03-19 ENCOUNTER — Other Ambulatory Visit: Payer: Medicaid Other

## 2023-03-19 DIAGNOSIS — R739 Hyperglycemia, unspecified: Secondary | ICD-10-CM | POA: Diagnosis not present

## 2023-03-19 DIAGNOSIS — E7800 Pure hypercholesterolemia, unspecified: Secondary | ICD-10-CM | POA: Diagnosis not present

## 2023-03-20 DIAGNOSIS — Z419 Encounter for procedure for purposes other than remedying health state, unspecified: Secondary | ICD-10-CM | POA: Diagnosis not present

## 2023-03-20 LAB — CBC
HCT: 39.3 % (ref 35.0–45.0)
Hemoglobin: 12.9 g/dL (ref 11.7–15.5)
MCH: 31 pg (ref 27.0–33.0)
MCHC: 32.8 g/dL (ref 32.0–36.0)
MCV: 94.5 fL (ref 80.0–100.0)
MPV: 10.6 fL (ref 7.5–12.5)
Platelets: 299 10*3/uL (ref 140–400)
RBC: 4.16 10*6/uL (ref 3.80–5.10)
RDW: 11.8 % (ref 11.0–15.0)
WBC: 5.6 10*3/uL (ref 3.8–10.8)

## 2023-03-20 LAB — LIPID PANEL
Cholesterol: 194 mg/dL (ref <200)
HDL: 63 mg/dL (ref >=50)
LDL Cholesterol (Calc): 117 mg/dL — ABNORMAL HIGH
Non-HDL Cholesterol (Calc): 131 mg/dL — ABNORMAL HIGH (ref <130)
Total CHOL/HDL Ratio: 3.1 (calc) (ref <5.0)
Triglycerides: 51 mg/dL (ref <150)

## 2023-03-20 LAB — HEMOGLOBIN A1C
Hgb A1c MFr Bld: 5.3 %{Hb} (ref <5.7)
Mean Plasma Glucose: 105 mg/dL
eAG (mmol/L): 5.8 mmol/L

## 2023-03-20 LAB — COMPLETE METABOLIC PANEL WITH GFR
AG Ratio: 1.9 (calc) (ref 1.0–2.5)
ALT: 11 U/L (ref 6–29)
AST: 11 U/L (ref 10–30)
Albumin: 4 g/dL (ref 3.6–5.1)
Alkaline phosphatase (APISO): 48 U/L (ref 31–125)
BUN: 19 mg/dL (ref 7–25)
CO2: 27 mmol/L (ref 20–32)
Calcium: 8.9 mg/dL (ref 8.6–10.2)
Chloride: 108 mmol/L (ref 98–110)
Creat: 0.65 mg/dL (ref 0.50–0.99)
Globulin: 2.1 g/dL (ref 1.9–3.7)
Glucose, Bld: 85 mg/dL (ref 65–99)
Potassium: 4.4 mmol/L (ref 3.5–5.3)
Sodium: 142 mmol/L (ref 135–146)
Total Bilirubin: 0.4 mg/dL (ref 0.2–1.2)
Total Protein: 6.1 g/dL (ref 6.1–8.1)
eGFR: 112 mL/min/{1.73_m2} (ref >=60)

## 2023-04-10 ENCOUNTER — Ambulatory Visit (INDEPENDENT_AMBULATORY_CARE_PROVIDER_SITE_OTHER): Payer: Medicaid Other

## 2023-04-10 ENCOUNTER — Ambulatory Visit: Payer: Medicaid Other | Admitting: Podiatry

## 2023-04-10 ENCOUNTER — Encounter: Payer: Self-pay | Admitting: Podiatry

## 2023-04-10 DIAGNOSIS — M722 Plantar fascial fibromatosis: Secondary | ICD-10-CM

## 2023-04-10 DIAGNOSIS — S92011S Displaced fracture of body of right calcaneus, sequela: Secondary | ICD-10-CM

## 2023-04-10 DIAGNOSIS — M7751 Other enthesopathy of right foot: Secondary | ICD-10-CM

## 2023-04-10 DIAGNOSIS — M779 Enthesopathy, unspecified: Secondary | ICD-10-CM

## 2023-04-10 MED ORDER — MELOXICAM 15 MG PO TABS: 15.0000 mg | ORAL_TABLET | Freq: Every day | ORAL | 3 refills | Status: DC
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
## 2023-04-20 DIAGNOSIS — Z419 Encounter for procedure for purposes other than remedying health state, unspecified: Secondary | ICD-10-CM | POA: Diagnosis not present

## 2023-05-18 DIAGNOSIS — Z419 Encounter for procedure for purposes other than remedying health state, unspecified: Secondary | ICD-10-CM | POA: Diagnosis not present

## 2023-05-18 DIAGNOSIS — J209 Acute bronchitis, unspecified: Secondary | ICD-10-CM | POA: Diagnosis not present

## 2023-05-18 DIAGNOSIS — Z20822 Contact with and (suspected) exposure to covid-19: Secondary | ICD-10-CM | POA: Diagnosis not present

## 2023-05-18 DIAGNOSIS — R070 Pain in throat: Secondary | ICD-10-CM | POA: Diagnosis not present

## 2023-05-20 ENCOUNTER — Ambulatory Visit
Admission: RE | Admit: 2023-05-20 | Discharge: 2023-05-20 | Disposition: A | Discharge status: Home / Self Care | Payer: Medicaid Other | Source: Ambulatory Visit | Attending: Obstetrics and Gynecology | Admitting: Obstetrics and Gynecology

## 2023-05-20 ENCOUNTER — Encounter: Payer: Self-pay | Admitting: Obstetrics and Gynecology

## 2023-05-20 DIAGNOSIS — R928 Other abnormal and inconclusive findings on diagnostic imaging of breast: Secondary | ICD-10-CM | POA: Insufficient documentation

## 2023-05-20 DIAGNOSIS — R92321 Mammographic fibroglandular density, right breast: Secondary | ICD-10-CM | POA: Diagnosis not present

## 2023-05-20 DIAGNOSIS — R921 Mammographic calcification found on diagnostic imaging of breast: Secondary | ICD-10-CM | POA: Diagnosis not present

## 2023-05-20 NOTE — Addendum Note (Signed)
 Addended by: Althea Grimmer B on: 05/20/2023 08:44 PM   Modules accepted: Orders

## 2023-05-22 ENCOUNTER — Encounter: Payer: Self-pay | Admitting: Podiatry

## 2023-05-22 ENCOUNTER — Ambulatory Visit: Payer: Medicaid Other | Admitting: Podiatry

## 2023-05-22 DIAGNOSIS — M722 Plantar fascial fibromatosis: Secondary | ICD-10-CM | POA: Diagnosis not present

## 2023-05-22 NOTE — Progress Notes (Signed)
  Subjective:  Patient ID: Maria Sandoval, female    DOB: [birth year],  MRN: [id number]  Chief Complaint  Patient presents with   Plantar Fasciitis    "He gave me a shot.  It lasted for a while but now I can tell it's coming back."    43 y.o. female presents with the above complaint. History confirmed with patient.  She notes about 40% improvement.  Has not been consistent with doing her exercises.  Objective:  Physical Exam: warm, good capillary refill, no trophic changes or ulcerative lesions, normal DP and PT pulses, normal sensory exam, and still some tenderness plantar medial heel.  Assessment:   1. Plantar fasciitis, left      Plan:  Patient was evaluated and treated and all questions answered.  Has improved, I did recommend a repeat injection and should be the last one she needs.  I did encouraged her on the home physical therapy plan and she will do this twice daily.  I expect this to be nearly fully resolved in the next 6 to 8 weeks, return if needed for further injection if it has not.   After sterile prep with povidone-iodine solution and alcohol, the left heel was injected with 0.5cc 2% xylocaine plain, 0.5cc 0.5% marcaine plain, 20mg  triamcinolone acetonide, and 4mg  dexamethasone was injected along the medial plantar fascia at the insertion on the plantar calcaneus. The patient tolerated the procedure well without complication.   No follow-ups on file.

## 2023-06-27 ENCOUNTER — Encounter: Payer: Self-pay | Admitting: Podiatry

## 2023-06-27 MED ORDER — MELOXICAM 15 MG PO TABS: 15.0000 mg | ORAL_TABLET | Freq: Every day | ORAL | 3 refills | Status: DC

## 2023-06-29 DIAGNOSIS — Z419 Encounter for procedure for purposes other than remedying health state, unspecified: Secondary | ICD-10-CM | POA: Diagnosis not present

## 2023-07-10 ENCOUNTER — Ambulatory Visit: Admitting: Podiatry

## 2023-07-10 ENCOUNTER — Ambulatory Visit (INDEPENDENT_AMBULATORY_CARE_PROVIDER_SITE_OTHER)

## 2023-07-10 ENCOUNTER — Encounter: Payer: Self-pay | Admitting: Podiatry

## 2023-07-10 VITALS — Ht 67.0 in | Wt 208.8 lb

## 2023-07-10 DIAGNOSIS — S93692A Other sprain of left foot, initial encounter: Secondary | ICD-10-CM

## 2023-07-10 DIAGNOSIS — M722 Plantar fascial fibromatosis: Secondary | ICD-10-CM

## 2023-07-10 NOTE — Patient Instructions (Signed)

## 2023-07-11 ENCOUNTER — Encounter: Payer: Self-pay | Admitting: Podiatry

## 2023-07-11 ENCOUNTER — Telehealth: Payer: Self-pay | Admitting: Podiatry

## 2023-07-11 NOTE — Progress Notes (Signed)
  Subjective:  Patient ID: Sunday Eng, female    DOB: Jan 21, 1981,  MRN: [id number]  Chief Complaint  Patient presents with   Foot Pain    Pt is here due to left foot pain, states a week or so ago she was walking and felt a pop or crack in the foot and has been in pain ever since states for 3 day after initial problem foot was very swollen and tender.    43 y.o. female presents with the above complaint. History confirmed with patient.  Had some swelling and bruising here that is improved  Objective:  Physical Exam: warm, good capillary refill, no trophic changes or ulcerative lesions, normal DP and PT pulses, normal sensory exam, and significant pain plantar medial heel   No fracture or stress fracture noted on today's x-rays of the left foot   Assessment:   1. Rupture of plantar fascia of left foot, initial encounter      Plan:  Patient was evaluated and treated and all questions answered.  We reviewed progress and the pop she felt I suspect likely has a partial plantar fascial tear.  I recommend an MRI to evaluate and to guide further treatment options with possible surgical consideration if the tear is only partial in nature.  We discussed the etiology and treatment options of these.  If there is a complete tear then hopefully should be able to heal in the next 4 weeks with CAM boot immobilization and rest.  This was dispensed today.  Job accommodations will be necessary.  I will see her back in 4 weeks to review.   Return in about 4 weeks (around 08/07/2023) for Follow-up plantar fascia rupture, after MRI to review.

## 2023-07-11 NOTE — Telephone Encounter (Signed)
 Patient is requesting a note for work stating, to take patient out of work for 4 weeks. Patient contact telephone number, [phone]

## 2023-07-16 ENCOUNTER — Encounter: Payer: Self-pay | Admitting: Podiatry

## 2023-07-17 NOTE — Telephone Encounter (Signed)
 [place] office sent form from [employer] that pt has given us  the ok to release to them. They will be sending  STD forms to be completed. See prev notes.

## 2023-07-18 DIAGNOSIS — Z0271 Encounter for disability determination: Secondary | ICD-10-CM

## 2023-07-18 NOTE — Telephone Encounter (Signed)
 Faxed STD forms/notes to [employer] [phone]. RTW approx 08/16/23

## 2023-07-29 DIAGNOSIS — Z419 Encounter for procedure for purposes other than remedying health state, unspecified: Secondary | ICD-10-CM | POA: Diagnosis not present

## 2023-07-31 ENCOUNTER — Telehealth: Payer: Self-pay

## 2023-07-31 NOTE — Telephone Encounter (Signed)
 Patient called and states that she is scheduled to return to work on 5/21, but she has not had the MRI yet. She wants to know what should she do. She was not given a follow up appointment with you on the 21 st as your note stated she is suppose to have.

## 2023-07-31 NOTE — Telephone Encounter (Signed)
 [facility] tried obtaining PA for MRI, but it was denied. It was denied on 07/23/23. Our office received no notification of denial or the notice of request for additional information.     I have sent ALL notes and imaging to see if an appeal will be approved. There may need to be a P2P done.

## 2023-08-01 NOTE — Telephone Encounter (Signed)
 Patient called and is willing to schedule appointment. You originally said 5/21 but your out of the office that week. Patient is stating she needs to be seen on or before 5/21 due to [facility] issues. Please advise

## 2023-08-01 NOTE — Telephone Encounter (Signed)
 Error

## 2023-08-06 ENCOUNTER — Encounter: Payer: Self-pay | Admitting: Podiatry

## 2023-08-06 ENCOUNTER — Ambulatory Visit: Admitting: Podiatry

## 2023-08-06 DIAGNOSIS — S93692A Other sprain of left foot, initial encounter: Secondary | ICD-10-CM

## 2023-08-06 NOTE — Progress Notes (Signed)
  Subjective:  Patient ID: Maria Sandoval, female    DOB: 1980-03-22,  MRN: [id number]  Chief Complaint  Patient presents with   Foot Pain    43 y.o. female presents with the above complaint. History confirmed with patient.  She states she continues to have pain has not gotten any better.  She was unable to get an MRI.  Hurts with ambulation.  She has been wearing her boot.  Objective:  Physical Exam: warm, good capillary refill, no trophic changes or ulcerative lesions, normal DP and PT pulses, normal sensory exam, and significant pain plantar medial heel   No fracture or stress fracture noted on today's x-rays of the left foot   Assessment:   1. Rupture of plantar fascia of left foot, initial encounter      Plan:  Patient was evaluated and treated and all questions answered.  We reviewed progress and the pop she felt I suspect likely has a partial plantar fascial tear.  Patient will benefit from an MRI.  Her MRI was denied previously for unknown reason.  At this time I will place the order for an MRI.  Patient will be scheduled to see Dr. Michalene Agee in 2 weeks. - She will also have her disability extended to 09/28/2023 and will return to work if she is healed prior to that.  No follow-ups on file.

## 2023-08-07 ENCOUNTER — Encounter: Payer: Self-pay | Admitting: Podiatry

## 2023-08-07 NOTE — Telephone Encounter (Signed)
 Faxed [employer] [phone] note to advise revised RTW 09/28/23

## 2023-08-07 NOTE — Telephone Encounter (Signed)
 pt lft mess on my vmail. I called back and lft mess on her vmail that revised RTW date 09/28/23 was faxed to [facility] this morning

## 2023-08-08 NOTE — Telephone Encounter (Signed)
 pt lft mess again on vmail and advised Nelson Bandy adv nothing recd. I cld bk and lft mess on her vmail I have confirmation page fax went thru but would do again.This is 3rd fax sent this month   I adv pt to check with [place] to see if there is another fax# we should be using if she is not getting them.

## 2023-08-13 ENCOUNTER — Telehealth: Payer: Self-pay

## 2023-08-13 ENCOUNTER — Other Ambulatory Visit: Payer: Self-pay | Admitting: Podiatry

## 2023-08-13 NOTE — Telephone Encounter (Signed)
 Additional information needed for MRI left ankle PA. Insurance is needing clinical information documenting dates and duration of treatment (physical therapy, chiropractic, osteopathic manipulative treatment or home exercise program) showing 4 weeks worth within the past 6 months.

## 2023-08-16 ENCOUNTER — Inpatient Hospital Stay: Admission: RE | Admit: 2023-08-16 | Source: Ambulatory Visit

## 2023-08-21 NOTE — Telephone Encounter (Signed)
 Patient called regarding PA status. She states that she has an appointment with PT tomorrow,but she states that she doesn't know if they will even want to continue seeing her without having MRI done. Apparently the insurance company never received the additional information to initiate an appeal for the heel MRI, but the last day to file an appeal for this is 7/5. I have once again provided information for the appeal for heel MRI. Would you like to continue with the PA process for the Ankle MRI or try to see about appeal again for the initial heel MRI. Patient is concerned about having to continue to be out of work

## 2023-08-22 ENCOUNTER — Ambulatory Visit: Attending: Podiatry

## 2023-08-22 DIAGNOSIS — R2689 Other abnormalities of gait and mobility: Secondary | ICD-10-CM | POA: Diagnosis not present

## 2023-08-22 DIAGNOSIS — M79672 Pain in left foot: Secondary | ICD-10-CM

## 2023-08-22 DIAGNOSIS — S93692A Other sprain of left foot, initial encounter: Secondary | ICD-10-CM | POA: Diagnosis not present
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
## 2023-08-22 NOTE — Telephone Encounter (Signed)
 See prev notes of faxes sent. No mess of another fax# to use. We have faxed 3 times in May.
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
## 2023-08-28 ENCOUNTER — Encounter

## 2023-08-29 DIAGNOSIS — Z419 Encounter for procedure for purposes other than remedying health state, unspecified: Secondary | ICD-10-CM | POA: Diagnosis not present

## 2023-08-29 NOTE — Telephone Encounter (Signed)
 I called to get an update on the appeal. It is still in review. The anticipated decision date is 09/22/23. It could be earlier then that. Could receive determination letter within 7-14 business days.

## 2023-09-02 ENCOUNTER — Ambulatory Visit

## 2023-09-02 DIAGNOSIS — R2689 Other abnormalities of gait and mobility: Secondary | ICD-10-CM

## 2023-09-02 DIAGNOSIS — M79672 Pain in left foot: Secondary | ICD-10-CM

## 2023-09-02 DIAGNOSIS — S93692A Other sprain of left foot, initial encounter: Secondary | ICD-10-CM
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
## 2023-09-11 ENCOUNTER — Ambulatory Visit

## 2023-09-11 DIAGNOSIS — S93692A Other sprain of left foot, initial encounter: Secondary | ICD-10-CM | POA: Diagnosis not present

## 2023-09-11 DIAGNOSIS — R2689 Other abnormalities of gait and mobility: Secondary | ICD-10-CM | POA: Diagnosis not present

## 2023-09-11 DIAGNOSIS — M79672 Pain in left foot: Secondary | ICD-10-CM
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
## 2023-09-14 ENCOUNTER — Ambulatory Visit
Admission: RE | Admit: 2023-09-14 | Discharge: 2023-09-14 | Disposition: A | Discharge status: Home / Self Care | Source: Ambulatory Visit | Attending: Podiatry | Admitting: Podiatry

## 2023-09-14 DIAGNOSIS — S93692A Other sprain of left foot, initial encounter: Secondary | ICD-10-CM

## 2023-09-14 DIAGNOSIS — M19072 Primary osteoarthritis, left ankle and foot: Secondary | ICD-10-CM | POA: Diagnosis not present

## 2023-09-14 DIAGNOSIS — M66872 Spontaneous rupture of other tendons, left ankle and foot: Secondary | ICD-10-CM | POA: Diagnosis not present

## 2023-09-14 DIAGNOSIS — M76822 Posterior tibial tendinitis, left leg: Secondary | ICD-10-CM | POA: Diagnosis not present

## 2023-09-16 ENCOUNTER — Telehealth: Payer: Self-pay

## 2023-09-16 ENCOUNTER — Ambulatory Visit

## 2023-09-16 NOTE — Telephone Encounter (Signed)
 Connected with Patient's fiance Darrold). PT advised of missed appt and no show policy. Recommended rescheduling to openings throughout the week. Fiance with understanding about appointment time and will have patient call back. No questions at end of phone call.   Lonni Pall PT, DPT Physical Therapist- [place]

## 2023-09-17 ENCOUNTER — Ambulatory Visit (INDEPENDENT_AMBULATORY_CARE_PROVIDER_SITE_OTHER): Payer: Self-pay | Admitting: Internal Medicine

## 2023-09-17 ENCOUNTER — Encounter: Payer: Self-pay | Admitting: Internal Medicine

## 2023-09-17 VITALS — BP 120/66 | Ht 67.0 in | Wt 204.8 lb

## 2023-09-17 DIAGNOSIS — Z872 Personal history of diseases of the skin and subcutaneous tissue: Secondary | ICD-10-CM | POA: Diagnosis not present

## 2023-09-17 DIAGNOSIS — Z0001 Encounter for general adult medical examination with abnormal findings: Secondary | ICD-10-CM | POA: Diagnosis not present

## 2023-09-17 DIAGNOSIS — E66811 Obesity, class 1: Secondary | ICD-10-CM

## 2023-09-17 DIAGNOSIS — E7800 Pure hypercholesterolemia, unspecified: Secondary | ICD-10-CM | POA: Diagnosis not present

## 2023-09-17 DIAGNOSIS — R739 Hyperglycemia, unspecified: Secondary | ICD-10-CM | POA: Diagnosis not present

## 2023-09-17 DIAGNOSIS — E6609 Other obesity due to excess calories: Secondary | ICD-10-CM

## 2023-09-17 DIAGNOSIS — N951 Menopausal and female climacteric states: Secondary | ICD-10-CM | POA: Diagnosis not present

## 2023-09-17 DIAGNOSIS — Z6832 Body mass index (BMI) 32.0-32.9, adult: Secondary | ICD-10-CM | POA: Diagnosis not present

## 2023-09-17 MED ORDER — TRIAMCINOLONE ACETONIDE 0.1 % EX CREA: 1.0000 | TOPICAL_CREAM | Freq: Two times a day (BID) | CUTANEOUS | 1 refills | Status: AC

## 2023-09-17 NOTE — Assessment & Plan Note (Signed)
 Encouraged diet and exercise for weight loss ?

## 2023-09-17 NOTE — Patient Instructions (Signed)

## 2023-09-17 NOTE — Progress Notes (Signed)
 Subjective:    Patient ID: Maria Sandoval, female    DOB: Sep 23, 1980, 43 y.o.   MRN: [id number]  HPI  Patient presents to clinic today for her annual exam.  Flu: Never Tetanus: 02/2012 COVID: Never Prevnar: Never Pap smear: 09/2022 Mammogram: 05/2023 Vision screening: annually Dentist: biannually  Diet: She does eat lean meat. She consumes fruits and veggies. She does eat some fried foods. She drinks mostly water, soda Exercise: None  Review of Systems   Past Medical History:  Diagnosis Date   Anxiety disorder     Current Outpatient Medications  Medication Sig Dispense Refill   meloxicam  (MOBIC ) 15 MG tablet Take 1 tablet (15 mg total) by mouth daily. 30 tablet 3   Multiple Vitamin (MULTIVITAMIN) tablet Take 1 tablet by mouth daily.     Probiotic Product (PROBIOTIC DAILY PO) Take 1 tablet by mouth daily.     triamcinolone  cream (KENALOG ) 0.1 % Apply 1 application to the affected area(s) topically 2 (two) times daily. 30 g 1   No current facility-administered medications for this visit.    No Known Allergies  Family History  Problem Relation Age of Onset   Hypertension Mother    Diabetes Mother    Anxiety disorder Mother    Depression Mother    Healthy Father    Seizures Sister    Colon cancer Paternal Uncle        late years   Diabetes Maternal Grandmother    Hypertension Maternal Grandmother    Heart attack Maternal Grandfather    Other Paternal Grandmother        unknown medical history   Other Paternal Grandfather        unknown medical hsitory   Breast cancer Neg Hx     Social History   Socioeconomic History   Marital status: Single    Spouse name: Not on file   Number of children: Not on file   Years of education: Not on file   Highest education level: Not on file  Occupational History   Not on file  Tobacco Use   Smoking status: Every Day    Current packs/day: 0.00    Types: Cigarettes, E-cigarettes    Last attempt to quit: 2019     Years since quitting: 6.5   Smokeless tobacco: Never   Tobacco comments:    I don't smoke anymore but I vape daily.  Vaping Use   Vaping status: Every Day   Substances: Nicotine , Flavoring  Substance and Sexual Activity   Alcohol use: Not Currently    Comment: last use ~ 2019   Drug use: Not Currently    Types: Cocaine, Marijuana    Comment: last use ~2021   Sexual activity: Not Currently    Partners: Male    Birth control/protection: None  Other Topics Concern   Not on file  Social History Narrative   Not on file   Social Drivers of Health   Financial Resource Strain: Not on file  Food Insecurity: Food Insecurity Present (06/20/2021)   Hunger Vital Sign    Worried About Running Out of Food in the Last Year: Sometimes true    Ran Out of Food in the Last Year: Sometimes true  Transportation Needs: No Transportation Needs (06/20/2021)   PRAPARE - Administrator, Civil Service (Medical): No    Lack of Transportation (Non-Medical): No  Physical Activity: Not on file  Stress: Not on file  Social Connections: Not on file  Intimate Partner Violence: Not on file     Constitutional: Denies fever, malaise, fatigue, headache or abrupt weight changes.  HEENT: Denies eye pain, eye redness, ear pain, ringing in the ears, wax buildup, runny nose, nasal congestion, bloody nose, or sore throat. Respiratory: Denies difficulty breathing, shortness of breath, cough or sputum production.   Cardiovascular: Denies chest pain, chest tightness, palpitations or swelling in the hands or feet.  Gastrointestinal: Denies abdominal pain, bloating, constipation, diarrhea or blood in the stool.  GU: Denies urgency, frequency, pain with urination, burning sensation, blood in urine, odor or discharge. Musculoskeletal: Pt reports left heel pain. Denies decrease in range of motion, difficulty with gait, muscle pain or joint swelling.  Skin: Patient reports eczema of the hands.  Denies redness,  rashes, lesions or ulcercations.  Neurological: Pt reports intermittent lightheadedness, hot flashes and night sweats. Denies dizziness, difficulty with memory, difficulty with speech or problems with balance and coordination.  Psych: Patient has a history of anxiety and depression.  Denies SI/HI.  No other specific complaints in a complete review of systems (except as listed in HPI above).      Objective:   Physical Exam  BP 120/66 (BP Location: Left Arm, Patient Position: Sitting, Cuff Size: Normal)   Ht 5' 7 (1.702 m)   Wt 204 lb 12.8 oz (92.9 kg)   LMP 08/27/2023 (Approximate)   BMI 32.08 kg/m   Wt Readings from Last 3 Encounters:  07/10/23 208 lb 12.8 oz (94.7 kg)  03/18/23 208 lb 12.8 oz (94.7 kg)  10/15/22 215 lb (97.5 kg)    General: Appears her stated age, obese, in NAD. Skin: Warm, dry and intact.  Dyshidrotic eczema noted of bilateral hands. HEENT: Head: normal shape and size; Eyes: sclera white, no icterus, conjunctiva pink, PERRLA and EOMs intact;  Neck:  Neck supple, trachea midline. No masses, lumps or thyromegaly present.  Cardiovascular: Normal rate and rhythm. S1,S2 noted.  No murmur, rubs or gallops noted. No JVD or BLE edema.  Pulmonary/Chest: Normal effort and positive vesicular breath sounds. No respiratory distress. No wheezes, rales or ronchi noted.  Abdomen:  Normal bowel sounds. Musculoskeletal: [place] 5/5 BUE/BLE. Left foot in boot. No difficulty with gait.  Neurological: Alert and oriented. Cranial nerves II-XII grossly intact. Coordination normal.  Psychiatric: Mood and affect normal. Behavior is normal. Judgment and thought content normal.    BMET    Component Value Date/Time   NA 142 03/19/2023 0955   K 4.4 03/19/2023 0955   CL 108 03/19/2023 0955   CO2 27 03/19/2023 0955   GLUCOSE 85 03/19/2023 0955   BUN 19 03/19/2023 0955   CREATININE 0.65 03/19/2023 0955   CALCIUM 8.9 03/19/2023 0955   GFRNONAA >60 10/25/2015 2154   GFRAA >60  10/25/2015 2154    Lipid Panel     Component Value Date/Time   CHOL 194 03/19/2023 0955   TRIG 51 03/19/2023 0955   HDL 63 03/19/2023 0955   CHOLHDL 3.1 03/19/2023 0955   VLDL 19 10/27/2015 0818   LDLCALC 117 (H) 03/19/2023 0955    CBC    Component Value Date/Time   WBC 5.6 03/19/2023 0955   RBC 4.16 03/19/2023 0955   HGB 12.9 03/19/2023 0955   HCT 39.3 03/19/2023 0955   PLT 299 03/19/2023 0955   MCV 94.5 03/19/2023 0955   MCH 31.0 03/19/2023 0955   MCHC 32.8 03/19/2023 0955   RDW 11.8 03/19/2023 0955   LYMPHSABS 2.5 10/25/2015 2154   MONOABS 0.6 10/25/2015  2154   EOSABS 0.1 10/25/2015 2154   BASOSABS 0.0 10/25/2015 2154    Hgb A1C Lab Results  Component Value Date   HGBA1C 5.3 03/19/2023            Assessment & Plan:  Preventative health maintenance:  Encouraged her to get a flu shot in the fall Tetanus declined Encouraged her to get her COVID-vaccine Prevnar 20 declined Pap smear UTD Mammogram already scheduled Encouraged her to consume a balanced diet and exercise regimen Advised her to see an eye doctor and dentist annually Will check CBC, c-Met, lipid, A1c today  Perimenopause:  Will check estrogen, estradiol, testosterone and progesterone Consider referral back to GYN for further evaluation and hormonal treatment if she is interested  RTC in 6 months for follow-up of chronic conditions Angeline Laura, NP

## 2023-09-18 ENCOUNTER — Ambulatory Visit

## 2023-09-18 ENCOUNTER — Ambulatory Visit: Payer: Self-pay | Admitting: Internal Medicine

## 2023-09-19 ENCOUNTER — Ambulatory Visit: Attending: Podiatry

## 2023-09-19 DIAGNOSIS — R2689 Other abnormalities of gait and mobility: Secondary | ICD-10-CM | POA: Insufficient documentation

## 2023-09-19 DIAGNOSIS — M79672 Pain in left foot: Secondary | ICD-10-CM | POA: Diagnosis not present

## 2023-09-19 DIAGNOSIS — S93692A Other sprain of left foot, initial encounter: Secondary | ICD-10-CM | POA: Insufficient documentation
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
## 2023-09-21 LAB — LIPID PANEL
Cholesterol: 198 mg/dL (ref <200)
HDL: 72 mg/dL (ref >=50)
LDL Cholesterol (Calc): 109 mg/dL — ABNORMAL HIGH
Non-HDL Cholesterol (Calc): 126 mg/dL (ref <130)
Total CHOL/HDL Ratio: 2.8 (calc) (ref <5.0)
Triglycerides: 77 mg/dL (ref <150)

## 2023-09-21 LAB — CBC
HCT: 41.2 % (ref 35.0–45.0)
Hemoglobin: 13.5 g/dL (ref 11.7–15.5)
MCH: 30.3 pg (ref 27.0–33.0)
MCHC: 32.8 g/dL (ref 32.0–36.0)
MCV: 92.4 fL (ref 80.0–100.0)
MPV: 10.4 fL (ref 7.5–12.5)
Platelets: 292 Thousand/uL (ref 140–400)
RBC: 4.46 Million/uL (ref 3.80–5.10)
RDW: 12.5 % (ref 11.0–15.0)
WBC: 6 Thousand/uL (ref 3.8–10.8)

## 2023-09-21 LAB — HEMOGLOBIN A1C
Hgb A1c MFr Bld: 5.1 % (ref <5.7)
Mean Plasma Glucose: 100 mg/dL
eAG (mmol/L): 5.5 mmol/L

## 2023-09-21 LAB — COMPREHENSIVE METABOLIC PANEL WITH GFR
AG Ratio: 2 (calc) (ref 1.0–2.5)
ALT: 14 U/L (ref 6–29)
AST: 14 U/L (ref 10–30)
Albumin: 4.3 g/dL (ref 3.6–5.1)
Alkaline phosphatase (APISO): 43 U/L (ref 31–125)
BUN: 15 mg/dL (ref 7–25)
CO2: 26 mmol/L (ref 20–32)
Calcium: 9 mg/dL (ref 8.6–10.2)
Chloride: 105 mmol/L (ref 98–110)
Creat: 0.59 mg/dL (ref 0.50–0.99)
Globulin: 2.1 g/dL (ref 1.9–3.7)
Glucose, Bld: 86 mg/dL (ref 65–139)
Potassium: 3.8 mmol/L (ref 3.5–5.3)
Sodium: 140 mmol/L (ref 135–146)
Total Bilirubin: 0.4 mg/dL (ref 0.2–1.2)
Total Protein: 6.4 g/dL (ref 6.1–8.1)
eGFR: 115 mL/min/1.73m2 (ref >=60)

## 2023-09-21 LAB — ESTROGENS, TOTAL: Estrogen: 227 pg/mL

## 2023-09-21 LAB — TESTOSTERONE, TOTAL, LC/MS/MS: Testosterone, Total, LC-MS-MS: 24 ng/dL (ref 2–45)

## 2023-09-21 LAB — ESTRADIOL: Estradiol: 73 pg/mL

## 2023-09-21 LAB — PROGESTERONE: Progesterone: 3.3 ng/mL

## 2023-09-23 ENCOUNTER — Ambulatory Visit: Payer: Self-pay | Admitting: Podiatry

## 2023-09-23 MED ORDER — TRAMADOL HCL 50 MG PO TABS: 50.0000 mg | ORAL_TABLET | Freq: Four times a day (QID) | ORAL | 0 refills | Status: AC | PRN

## 2023-09-24 ENCOUNTER — Ambulatory Visit

## 2023-09-25 ENCOUNTER — Telehealth: Payer: Self-pay | Admitting: Podiatry

## 2023-09-25 ENCOUNTER — Ambulatory Visit: Admitting: Podiatry

## 2023-09-25 VITALS — Ht 67.0 in | Wt 204.8 lb

## 2023-09-25 DIAGNOSIS — M722 Plantar fascial fibromatosis: Secondary | ICD-10-CM

## 2023-09-25 DIAGNOSIS — M65969 Unspecified synovitis and tenosynovitis, unspecified lower leg: Secondary | ICD-10-CM | POA: Diagnosis not present

## 2023-09-25 DIAGNOSIS — M65979 Unspecified synovitis and tenosynovitis, unspecified ankle and foot: Secondary | ICD-10-CM

## 2023-09-25 NOTE — Progress Notes (Signed)
 Subjective:  Patient ID: Maria Sandoval, female    DOB: 06/10/1980,  MRN: [id number]  Chief Complaint  Patient presents with   Plantar Fasciitis    Pt is here to f/u on left foot due to rupture of plantar fascia states she is still having pain in the foot, here to review MRI results and possibly set up surgery if needed.    43 y.o. female presents with the above complaint. History confirmed with patient.  Overall has not had much improvement the tramadol  has helped  Objective:  Physical Exam: warm, good capillary refill, no trophic changes or ulcerative lesions, normal DP and PT pulses, normal sensory exam, and significant pain plantar medial and central heel.  She does have pain along the posterior tibial and peroneal tendon sheaths in the retromalleolar areas, she has good 5 out of 5 strength   No fracture or stress fracture noted on today's x-rays of the left foot  Narrative & Impression  CLINICAL DATA:  Foot pain.  Concern for plantar fascia rupture.   EXAM: MR OF THE LEFT HEEL WITHOUT CONTRAST   TECHNIQUE: Multiplanar, multisequence MR imaging of the left hindfoot was performed. No intravenous contrast was administered.   COMPARISON:  Left foot radiographs dated 07/10/2023.   FINDINGS: Bones/Joint/Cartilage   No fracture or dislocation. Normal alignment. No joint effusion. No marrow signal abnormality. Mild talonavicular joint space narrowing and dorsal spurring.   Ligaments   Medial and lateral ankle ligamentous structures are intact. Spring ligament is intact. Lisfranc ligament is intact.   Muscles and Tendons   Near complete tear of the proximal central cord of the plantar fascia at the level of the inferior calcaneal origin with approximately 8 mm of distal retraction of the thickened tendon stump (series 106, images 8 and 9). There is mild surrounding edema.   Flexor, peroneal and extensor compartment tendons are intact. Mild tenosynovitis of the  posterior tibialis tendon and common peroneal tendon sheath at the retromalleolar level. Muscles are within normal limits.   Soft tissue No fluid collection or hematoma.  No soft tissue mass.   IMPRESSION: 1. Essentially complete tear of the proximal central cord of the plantar fascia at the inferior calcaneal origin with approximately 8 mm of retraction. 2. Mild tenosynovitis of the posterior tibialis tendon and common peroneal tendon sheath at the retromalleolar level. 3. Mild talonavicular osteoarthritis.     Electronically Signed   By: Harrietta Sherry M.D.   On: 09/14/2023 14:44    Assessment:   1. Tenosynovitis of tibialis posterior tendon   2. Peroneal tenosynovitis   3. Plantar fasciitis, left      Plan:  Patient was evaluated and treated and all questions answered.  We reviewed her MRI results and progress thus far.  She still has quite significant pain and has been in the boot for the last month since her plantar fascia rupture.  With little bit of improvement I suspect most of her pain and strain is on the medial and lateral bands now and we discussed surgical treatment options.  I recommend endoscopic plantar fasciotomy of the remaining bands as well as Topaz and PRP injection of the plantar fascia.  Additionally she does now have pain and likely compensatory overload and tenosynovitis of the posterior tibial and peroneal tendon sheaths.  I recommended tenolysis and drainage of the tenosynovitis as well as PRP injection and Topaz of the structures as well.  We discussed the recovery process and expect she will be in the  boot for at least 6 to 8 weeks following surgery and will begin physical therapy again 3 weeks after she may take a break from therapy now until that time.  When her sutures have healed.  Plan for surgery next week.  All risk benefits and potential complications discussed.    Surgical plan:  Procedure: - Left foot EPF, tenolysis and PRP injection,  Topaz of plantar fascia posterior tibial and peroneal tendons   Location: -[facility]  Anesthesia plan: - Sedation with regional block  Postoperative pain plan: - Tylenol  1000 mg every 6 hours, ibuprofen 600 mg every 6 hours,  tramadol  50 mg  tabs every 6 hours only as needed  DVT prophylaxis: - None required  WB Restrictions / DME needs: - WBAT in CAM boot postop   No follow-ups on file.

## 2023-09-25 NOTE — Telephone Encounter (Signed)
 Received surgical consent.  Pt scheduled for 7/14 and is aware, pt is not on blood thinners or any glp1 medications and pharmacy is correct in chart.

## 2023-09-25 NOTE — Telephone Encounter (Signed)
 PER NIEKA F AT [facility] MEDICAID NO AUTH IS NEEDED FOR CPT [id number] OR CPT [phone]  DOS 09/30/23  REF # [id number]

## 2023-09-26 ENCOUNTER — Telehealth: Payer: Self-pay

## 2023-09-26 ENCOUNTER — Ambulatory Visit

## 2023-09-26 NOTE — Telephone Encounter (Signed)
 09/26/2023   Called Patient about missed appointment. She reported that she forgot to call yesterday to cancel today's appointment and next three weeks. Patient f/u with MD regarding the recent MRI. Per MD Marrie, Adam, DPM) We discussed the recovery process and expect she will be in the boot for at least 6 to 8 weeks following surgery and will begin physical therapy again 3 weeks after she may take a break from therapy now until that time. PT explained that insurance authorization is only until 10/26/2023 then OPPT will resubmit for additional visits following a Re-Eval. No further questions or concerns at the end of phone call.   Lonni Pall PT, DPT Physical Therapist- [facility]

## 2023-09-27 ENCOUNTER — Other Ambulatory Visit: Payer: Self-pay | Admitting: Podiatry

## 2023-09-28 DIAGNOSIS — Z419 Encounter for procedure for purposes other than remedying health state, unspecified: Secondary | ICD-10-CM | POA: Diagnosis not present

## 2023-09-30 ENCOUNTER — Telehealth: Payer: Self-pay | Admitting: Podiatry

## 2023-09-30 MED ORDER — TRAMADOL HCL 50 MG PO TABS: 50.0000 mg | ORAL_TABLET | Freq: Four times a day (QID) | ORAL | 0 refills | Status: DC | PRN

## 2023-09-30 NOTE — Addendum Note (Signed)
 Addended byBETHA MEDICINE, Juanell Saffo R on: 09/30/2023 02:44 PM   Modules accepted: Orders

## 2023-09-30 NOTE — Telephone Encounter (Signed)
 Patient was calling to see if she can get a refill of Tramadol .
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
## 2023-10-01 ENCOUNTER — Ambulatory Visit

## 2023-10-01 DIAGNOSIS — Z0271 Encounter for disability determination: Secondary | ICD-10-CM

## 2023-10-01 NOTE — Telephone Encounter (Signed)
 PT RESCHEDULED FOR 10/18/23 AS THAT WAS NEXT AVAILABLE. SHE IS WANTING ONE SOONER IF ANYTHING CANCELS.

## 2023-10-01 NOTE — Telephone Encounter (Signed)
 S/w pt and adv recd forms from [employer] and will send updated since DOS is now 10/18/23. She has been OOW since 07/10/23- adv her last was faxed and confirmation 08/08/23-she said they said nothing recd(faxed 3times-before going thru). RTW is 12/23/23 approx. She has to wear steel toed shoes/boots so RTW maybe extended. I adv her no problem if have to extend

## 2023-10-03 ENCOUNTER — Ambulatory Visit

## 2023-10-07 ENCOUNTER — Other Ambulatory Visit: Payer: Self-pay | Admitting: Podiatry

## 2023-10-07 ENCOUNTER — Encounter

## 2023-10-08 ENCOUNTER — Encounter

## 2023-10-09 ENCOUNTER — Encounter: Admitting: Podiatry

## 2023-10-10 ENCOUNTER — Encounter

## 2023-10-13 ENCOUNTER — Other Ambulatory Visit: Payer: Self-pay | Admitting: Podiatry

## 2023-10-15 ENCOUNTER — Encounter

## 2023-10-17 ENCOUNTER — Encounter

## 2023-10-18 ENCOUNTER — Other Ambulatory Visit: Payer: Self-pay | Admitting: Podiatry

## 2023-10-18 DIAGNOSIS — M76822 Posterior tibial tendinitis, left leg: Secondary | ICD-10-CM | POA: Diagnosis not present

## 2023-10-18 DIAGNOSIS — M65962 Unspecified synovitis and tenosynovitis, left lower leg: Secondary | ICD-10-CM | POA: Diagnosis not present

## 2023-10-18 DIAGNOSIS — M722 Plantar fascial fibromatosis: Secondary | ICD-10-CM | POA: Diagnosis not present

## 2023-10-18 DIAGNOSIS — M65872 Other synovitis and tenosynovitis, left ankle and foot: Secondary | ICD-10-CM | POA: Diagnosis not present

## 2023-10-18 DIAGNOSIS — M7672 Peroneal tendinitis, left leg: Secondary | ICD-10-CM | POA: Diagnosis not present

## 2023-10-18 DIAGNOSIS — G8918 Other acute postprocedural pain: Secondary | ICD-10-CM | POA: Diagnosis not present

## 2023-10-18 HISTORY — PX: OTHER SURGICAL HISTORY: SHX169

## 2023-10-18 MED ORDER — OXYCODONE HCL 5 MG PO TABS
5.0000 mg | ORAL_TABLET | ORAL | 0 refills | Status: AC | PRN
Start: 2023-10-18 — End: 2023-10-25

## 2023-10-18 MED ORDER — GABAPENTIN 300 MG PO CAPS: 300.0000 mg | ORAL_CAPSULE | Freq: Three times a day (TID) | ORAL | 0 refills | Status: DC

## 2023-10-18 MED ORDER — IBUPROFEN 600 MG PO TABS: 600.0000 mg | ORAL_TABLET | Freq: Four times a day (QID) | ORAL | 0 refills | Status: AC | PRN

## 2023-10-18 MED ORDER — ACETAMINOPHEN 500 MG PO TABS: 1000.0000 mg | ORAL_TABLET | Freq: Four times a day (QID) | ORAL | 0 refills | Status: AC | PRN

## 2023-10-22 ENCOUNTER — Encounter

## 2023-10-23 ENCOUNTER — Other Ambulatory Visit: Payer: Self-pay | Admitting: Podiatry

## 2023-10-23 ENCOUNTER — Ambulatory Visit (INDEPENDENT_AMBULATORY_CARE_PROVIDER_SITE_OTHER): Admitting: Podiatry

## 2023-10-23 VITALS — Ht 67.0 in | Wt 204.8 lb

## 2023-10-23 DIAGNOSIS — M722 Plantar fascial fibromatosis: Secondary | ICD-10-CM

## 2023-10-23 DIAGNOSIS — M65979 Unspecified synovitis and tenosynovitis, unspecified ankle and foot: Secondary | ICD-10-CM

## 2023-10-23 DIAGNOSIS — M65969 Unspecified synovitis and tenosynovitis, unspecified lower leg: Secondary | ICD-10-CM

## 2023-10-24 ENCOUNTER — Encounter

## 2023-10-25 ENCOUNTER — Encounter: Payer: Self-pay | Admitting: Podiatry

## 2023-10-25 ENCOUNTER — Other Ambulatory Visit: Payer: Self-pay | Admitting: Podiatry

## 2023-10-25 NOTE — Progress Notes (Signed)
  Subjective:  Patient ID: Maria Sandoval, female    DOB: 1980-06-13,  MRN: [id number]  Chief Complaint  Patient presents with   Post-op Follow-up    POV # 1 DOS 10/18/23 LT EPF W/ PRP, LT TENOLYSIS POSTERIOR TIBAL AND PERONEAL TENDONS W/TOPAZ. Patient states moderate when placing pressure on the left foot. Surgical sites are healing, sutures are intact with moderate swelling and bruising.     43 y.o. female returns for post-op check.   Review of Systems: Negative except as noted in the HPI. Denies N/V/F/Ch.   Objective:  There were no vitals filed for this visit. Body mass index is 32.08 kg/m. Constitutional Well developed. Well nourished.  Vascular Foot warm and well perfused. Capillary refill normal to all digits.  Calf is soft and supple, no posterior calf or knee pain, negative Homans' sign  Neurologic Normal speech. Oriented to person, place, and time. Epicritic sensation to light touch grossly present bilaterally.  Dermatologic Skin healing well without signs of infection. Skin edges well coapted without signs of infection.  Orthopedic: Tenderness to palpation noted about the surgical site.    Assessment:   1. Tenosynovitis of tibialis posterior tendon   2. Peroneal tenosynovitis   3. Plantar fasciitis, left    Plan:  Patient was evaluated and treated and all questions answered.  S/p foot surgery left -Progressing as expected post-operatively. - Weightbearing as tolerated in cam boot.  Okay to remove boot for range of motion of toes and ankle.  Okay to shower.  Return in 2 weeks to have sutures removed.  Restart PT after that  No follow-ups on file.

## 2023-10-29 ENCOUNTER — Encounter

## 2023-10-29 DIAGNOSIS — Z419 Encounter for procedure for purposes other than remedying health state, unspecified: Secondary | ICD-10-CM | POA: Diagnosis not present

## 2023-10-31 ENCOUNTER — Encounter

## 2023-11-01 ENCOUNTER — Other Ambulatory Visit: Payer: Self-pay | Admitting: Podiatry

## 2023-11-04 ENCOUNTER — Telehealth: Payer: Self-pay | Admitting: Podiatry

## 2023-11-04 DIAGNOSIS — Z0271 Encounter for disability determination: Secondary | ICD-10-CM

## 2023-11-04 NOTE — Telephone Encounter (Signed)
 See notes.

## 2023-11-04 NOTE — Telephone Encounter (Signed)
 Recd forms from [employer]. Faxed to [phone] notes and forms. DOS 10/18/23 Approx RTW 12/23/23

## 2023-11-06 ENCOUNTER — Ambulatory Visit (INDEPENDENT_AMBULATORY_CARE_PROVIDER_SITE_OTHER): Admitting: Podiatry

## 2023-11-06 ENCOUNTER — Encounter: Payer: Self-pay | Admitting: Podiatry

## 2023-11-06 VITALS — Ht 67.0 in | Wt 204.8 lb

## 2023-11-06 DIAGNOSIS — M65969 Unspecified synovitis and tenosynovitis, unspecified lower leg: Secondary | ICD-10-CM

## 2023-11-06 DIAGNOSIS — S93692A Other sprain of left foot, initial encounter: Secondary | ICD-10-CM

## 2023-11-06 DIAGNOSIS — M65979 Unspecified synovitis and tenosynovitis, unspecified ankle and foot: Secondary | ICD-10-CM

## 2023-11-06 NOTE — Progress Notes (Signed)
  Subjective:  Patient ID: Maria Sandoval, female    DOB: 09/08/1980,  MRN: [id number]  Chief Complaint  Patient presents with   Post-op Follow-up    Rm 1 POV # 1 DOS 10/18/23 LT EPF W/ PRP, LT TENOLYSIS POSTERIOR TIBAL AND PERONEAL TENDONS W/TOPAZ.SABRA Patient states an inability to flex the fourth and fifth left toe forward.     43 y.o. female returns for post-op check.  Still having some pain still using the knee scooter  Review of Systems: Negative except as noted in the HPI. Denies N/V/F/Ch.   Objective:  There were no vitals filed for this visit. Body mass index is 32.08 kg/m. Constitutional Well developed. Well nourished.  Vascular Foot warm and well perfused. Capillary refill normal to all digits.  Calf is soft and supple, no posterior calf or knee pain, negative Homans' sign  Neurologic Normal speech. Oriented to person, place, and time. Epicritic sensation to light touch grossly present bilaterally.  Dermatologic Skin healing well without signs of infection. Skin edges well coapted without signs of infection.  Orthopedic: Mild tenderness to palpation noted about the surgical site.    Assessment:   1. Tenosynovitis of tibialis posterior tendon   2. Peroneal tenosynovitis   3. Rupture of plantar fascia of left foot, initial encounter     Plan:  Patient was evaluated and treated and all questions answered.  S/p foot surgery left - Sutures removed.  Encouraged to begin weightbearing in the cam boot.  Start PT next week.  Follow-up in 3 weeks and hopefully can start transitioning away from the cam boot at that point if she is comfortable  No follow-ups on file.

## 2023-11-06 NOTE — Addendum Note (Signed)
 Addended byBETHA MEDICINE, Ida Milbrath R on: 11/06/2023 05:08 PM   Modules accepted: Orders

## 2023-11-07 ENCOUNTER — Other Ambulatory Visit: Payer: Self-pay | Admitting: Podiatry

## 2023-11-10 NOTE — Therapy (Incomplete)
 OUTPATIENT PHYSICAL THERAPY ANKLE EVALUATION   Patient Name: Maria Sandoval MRN: [id number] DOB:14-Apr-1980, 43 y.o., female Today's Date: 11/10/2023  END OF SESSION:   Past Medical History:  Diagnosis Date   Anxiety disorder    Past Surgical History:  Procedure Laterality Date   ANKLE SURGERY Right    Patient Active Problem List   Diagnosis Date Noted   Plantar fasciitis of left foot 03/18/2023   Pure hypercholesterolemia 03/18/2023   Eczema 03/18/2023   Anxiety and depression 03/18/2023   Class 1 obesity due to excess calories with body mass index (BMI) of 32.0 to 32.9 in adult 03/18/2023    PCP: Antonette Angeline ORN, NP  REFERRING PROVIDER:  Silva Juliene SAUNDERS, DPM      REFERRING DIAG:  [phone] (ICD-10-CM) - Tenosynovitis of tibialis posterior tendon  M65.979 (ICD-10-CM) - Peroneal tenosynovitis  D06.307J (ICD-10-CM) - Rupture of plantar fascia of left foot, initial encounter    Rationale for Evaluation and Treatment: Rehabilitation  THERAPY DIAG: Pain in left foot  Other abnormalities of gait and mobility  Muscle weakness (generalized)  ONSET DATE: ***  FOLLOW-UP APPT SCHEDULED WITH REFERRING PROVIDER: {yes/no:20286}  SUBJECTIVE:                                                                                                                                                                                         SUBJECTIVE STATEMENT:    Patient reports to OPPT with a chief concern of L ankle pain following: LT EPF W PRP, LT TENOLYSIS POSTERIOR TIBIAL AND PERONEAL TENDONS W/TOPAZ (10/18/2023)  PERTINENT HISTORY:   Maria Sandoval is a 43 y.o. female was previously seen at this location for PT regarding plantar fascia rupture and is now presenting with L foot pain following L surgical procedure (See above) on 10/18/2023.  She reports *** since the surgical date. She still has difficulty with ***. Currently ambulating with *** and CAM Boot. Her physician reported  ***. Aggravating factors: *** Relieving factors: ***  She denies numbness and tingling, nausea, fever and parasthesia.   Imaging: {yes/no:20286}   PAIN:    Pain Intensity: Present: /10, Best: /10, Worst: /10 Pain location: *** Pain Quality: {PAIN DESCRIPTION:21022940}  Radiating: {yes/no:20286}  Swelling: {yes/no:20286}  Popping, catching, locking: {yes/no:20286}  Focal Weakness: {yes/no:20286} Typical footwear:   PRECAUTIONS: Fall  WEIGHT BEARING RESTRICTIONS:  Per Dr. Silva:  Restart physical therapy twice per week for 6 to 8 weeks no restrictions on weightbearing focus on strengthening conditioning manual tissue manipulation.  May be out of boot for weightbearing as tolerated   FALLS: Has patient fallen in last 6 months? 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  Recent memory is intact.  Remote memory is intact.  Attention span and concentration are intact.  Expressive speech is intact.  Patient's fund of knowledge is within normal limits for educational level.    Gross Musculoskeletal Assessment Bulk: Normal Tone: Normal No trophic changes noted to foot/ankle. No ecchymosis, erythema, or edema noted. No gross ankle/foot deformity noted  GAIT: Distance walked: *** Assistive device utilized: {Assistive devices:23999} Level of assistance: {Levels of assistance:24026} Comments: No excessive pronation/supination noted during gait. No excessive shoe wear noted on medial or lateral surfaces  Posture:  AROM AROM (Normal range in  degrees) AROM   Lumbar   Flexion (65)   Extension (30)   Right lateral flexion (25)   Left lateral flexion (25)   Right rotation (30)   Left rotation (30)       Hip Right Left  Flexion (125)    Extension (15)    Abduction (40)    Adduction     Internal Rotation (45)    External Rotation (45)        Knee    Flexion (135)    Extension (0)        Ankle    Dorsiflexion (20)    Plantarflexion (50)    Inversion (35)    Eversion (15)    (* = pain; Blank rows = not tested)   LE MMT: MMT (out of 5) Right  Left   Hip flexion    Hip extension    Hip abduction    Hip adduction    Hip internal rotation    Hip external rotation    Knee flexion    Knee extension    Ankle dorsiflexion    Ankle plantarflexion    Ankle inversion    Ankle eversion    (* = pain; Blank rows = not tested)  Sensation Grossly intact to light touch throughout bilateral LEs as determined by testing dermatomes L2-S2. Proprioception, stereognosis, and hot/cold testing deferred on this date.  Reflexes R/L Knee Jerk (L3/4): 2+/2+  Ankle Jerk (S1/2): 2+/2+   Muscle Length Hamstrings: R: {NEGATIVE/POSITIVE QNM:80001} L: {NEGATIVE/POSITIVE QNM:80001} Quadriceps Arvid): R: {NEGATIVE/POSITIVE QNM:80001} L: {NEGATIVE/POSITIVE QNM:80001} Hip flexors Cathy): R: {NEGATIVE/POSITIVE QNM:80001} L: {NEGATIVE/POSITIVE QNM:80001} IT band Everlene): R: {NEGATIVE/POSITIVE QNM:80001} L: {NEGATIVE/POSITIVE QNM:80001}  Palpation Location LEFT  RIGHT           Adductor Tubercle    Pes Anserine tendon    Infrapatellar fat pad    Fibular head    Popliteal fossa    (Blank rows = not tested) Graded on 0-4 scale (0 = no pain, 1 = pain, 2 = pain with wincing/grimacing/flinching, 3 = pain with withdrawal, 4 = unwilling to allow palpation), (Blank rows = not tested)  Passive Accessory Motion Superior Tibiofibular Joint: WNL Inferior Tibiofibular Joint: WNL Talocrural Joint Distraction: WNL Talocrural Joint AP:  WNL Talocrural Joint PA: WNL  VASCULAR Dorsalis pedis and posterior tibial pulses are palpable  SPECIAL TESTS Ligamentous Integrity Anterior Drawer (ATF, 10-15 plantarflexion with anterior translation): {NEGATIVE/POSITIVE QNM:80001} Talar Tilt (CFL, inversion): {NEGATIVE/POSITIVE  QNM:80001} Eversion Stress Test (Deltoid, eversion): {NEGATIVE/POSITIVE QNM:80001} External Rotation Test (High ankle, dorsiflexion and external rotation): {NEGATIVE/POSITIVE QNM:80001} Squeeze Test (High ankle): {NEGATIVE/POSITIVE QNM:80001} Impingment Sign (Dorsiflexion and eversion): {NEGATIVE/POSITIVE QNM:80001}  Achilles Integrity Thompson Test: {NEGATIVE/POSITIVE QNM:80001}   TODAY'S TREATMENT: DATE: 11/10/23  Evaluation Only      PATIENT EDUCATION:  Education details: HEP; Prognosis; POC  Person educated: Patient Education method: Explanation and Handouts Education comprehension: verbalized understanding and returned demonstration   HOME EXERCISE PROGRAM:    ASSESSMENT:  CLINICAL IMPRESSION: Varshini Arrants is a 43 y.o. femlae who was seen today for physical therapy evaluation and treatment for management of L foot pain following surgical intervention. Patient completely independent with ability to walk with CAM boot and knee scooter however ***.  ***  Patient will benefit from skilled PT interventions in order to facilitate return to PLOF, decrease risk of falls and maximize safe discharge to work related tasks.   OBJECTIVE IMPAIRMENTS: {opptimpairments:25111}.   ACTIVITY LIMITATIONS: {activitylimitations:27494}  PARTICIPATION LIMITATIONS: {participationrestrictions:25113}  PERSONAL FACTORS: {Personal factors:25162} are also affecting patient's functional outcome.   REHAB POTENTIAL: {rehabpotential:25112}  CLINICAL DECISION MAKING: {clinical decision making:25114}  EVALUATION COMPLEXITY: {Evaluation complexity:25115}   GOALS: Goals reviewed with patient?  {yes/no:20286}  SHORT TERM GOALS: Target date: {follow up:25551}  Pt will be independent with HEP to improve strength and decrease ankle pain to improve pain-free function at home and work. Baseline: *** Goal status: INITIAL   LONG TERM GOALS: Target date: {follow up:25551}  The patient will improve their FAD score by at least 8-10 points reflecting a clinically meaningful change indicating improved foot and ankle function for daily activities. Baseline: 11/11/23:  Goal status: INITIAL  2.  Pt will decrease worst ankle pain by at least 3 points on the NPRS in order to demonstrate clinically significant reduction in ankle pain. Baseline: 11/11/23: Goal status: INITIAL  3. Patient will be able to safely navigate a flight of 6 stairs, using proper foot placement, without handrail support, and without requiring assistance from PT in order to demonstrate significant improvement in LE strength and safety. Baseline: 11/11/23: Goal status: INITIAL  4.  Pt will increase strength of *** by at least 1/2 MMT grade in order to demonstrate improvement in strength and function. Baseline: 11/11/23: Goal status: INITIAL  5.  Pt will increase AROM of L Ankle DF/PF by at least 10 deg pain free in order to demonstrate improvement in pain and ankle mobility for normalized gait pattern. Baseline: 11/11/23: ***deg/***deg Goal status: INITIAL  6. Pt will increase by at least 0.13 m/s with LRAD in order to demonstrate clinically significant improvement in community ambulation.  Baseline: 11/11/23: ***, CAM Boot  Goal status: INITIAL      PLAN: PT FREQUENCY: 1-2x/week  PT DURATION: 12 weeks  PLANNED INTERVENTIONS: Therapeutic exercises, Therapeutic activity, Neuromuscular re-education, Balance training, Gait training, Patient/Family education, Self Care, Joint mobilization, Joint manipulation, Vestibular training, Canalith repositioning, Orthotic/Fit training, DME instructions, Dry Needling,  Electrical stimulation, Spinal manipulation, Spinal mobilization, Cryotherapy, Moist heat, Taping, Traction, Ultrasound, Ionotophoresis 4mg /ml Dexamethasone , Manual therapy, and Re-evaluation.  PLAN FOR NEXT SESSION: Review HEP; Initiate ankle mobility, Weight Bearing Progression, Ankle Strengthening, Knee/hip Strengthening   Lonni Pall PT, DPT Physical Therapist- [place]  11/10/2023, 11:04 PM

## 2023-11-11 ENCOUNTER — Ambulatory Visit

## 2023-11-11 DIAGNOSIS — M6281 Muscle weakness (generalized): Secondary | ICD-10-CM

## 2023-11-11 DIAGNOSIS — M79672 Pain in left foot: Secondary | ICD-10-CM

## 2023-11-11 DIAGNOSIS — R2689 Other abnormalities of gait and mobility: Secondary | ICD-10-CM

## 2023-11-13 ENCOUNTER — Ambulatory Visit

## 2023-11-13 ENCOUNTER — Ambulatory Visit: Attending: Podiatry

## 2023-11-13 ENCOUNTER — Encounter: Admitting: Podiatry

## 2023-11-13 DIAGNOSIS — R2689 Other abnormalities of gait and mobility: Secondary | ICD-10-CM | POA: Insufficient documentation

## 2023-11-13 DIAGNOSIS — S93692A Other sprain of left foot, initial encounter: Secondary | ICD-10-CM | POA: Diagnosis not present

## 2023-11-13 DIAGNOSIS — M79672 Pain in left foot: Secondary | ICD-10-CM | POA: Diagnosis not present

## 2023-11-13 DIAGNOSIS — M6281 Muscle weakness (generalized): Secondary | ICD-10-CM | POA: Insufficient documentation

## 2023-11-13 DIAGNOSIS — M65979 Unspecified synovitis and tenosynovitis, unspecified ankle and foot: Secondary | ICD-10-CM | POA: Insufficient documentation

## 2023-11-13 DIAGNOSIS — M65969 Unspecified synovitis and tenosynovitis, unspecified lower leg: Secondary | ICD-10-CM | POA: Diagnosis not present

## 2023-11-13 DIAGNOSIS — M25675 Stiffness of left foot, not elsewhere classified: Secondary | ICD-10-CM | POA: Insufficient documentation

## 2023-11-13 NOTE — Therapy (Signed)
 OUTPATIENT PHYSICAL THERAPY ANKLE EVALUATION   Patient Name: Maria Sandoval MRN: [id number] DOB:1980-07-13, 43 y.o., female Today's Date: 11/13/2023  END OF SESSION:  PT End of Session - 11/13/23 1032     Visit Number 1    Number of Visits 17    Date for PT Re-Evaluation 01/08/24    PT Start Time 1031    PT Stop Time 1115    PT Time Calculation (min) 44 min    Activity Tolerance Patient tolerated treatment well    Behavior During Therapy [facility] for tasks assessed/performed          Past Medical History:  Diagnosis Date   Anxiety disorder    Past Surgical History:  Procedure Laterality Date   ANKLE SURGERY Right    Patient Active Problem List   Diagnosis Date Noted   Plantar fasciitis of left foot 03/18/2023   Pure hypercholesterolemia 03/18/2023   Eczema 03/18/2023   Anxiety and depression 03/18/2023   Class 1 obesity due to excess calories with body mass index (BMI) of 32.0 to 32.9 in adult 03/18/2023    PCP: Antonette Angeline ORN, NP  REFERRING PROVIDER:  Silva Juliene SAUNDERS, DPM      REFERRING DIAG:  [phone] (ICD-10-CM) - Tenosynovitis of tibialis posterior tendon  M65.979 (ICD-10-CM) - Peroneal tenosynovitis  D06.307J (ICD-10-CM) - Rupture of plantar fascia of left foot, initial encounter    Rationale for Evaluation and Treatment: Rehabilitation  THERAPY DIAG: Pain in left foot  Other abnormalities of gait and mobility  Muscle weakness (generalized)  ONSET DATE: 10/18/2023  FOLLOW-UP APPT SCHEDULED WITH REFERRING PROVIDER: Yes  SUBJECTIVE:                                                                                                                                                                                         SUBJECTIVE STATEMENT:    Patient reports to OPPT with a chief concern of L ankle pain following: LT EPF W PRP, LT TENOLYSIS POSTERIOR TIBIAL AND PERONEAL TENDONS W/TOPAZ (10/18/2023)  PERTINENT HISTORY:   Maria Sandoval is a 43 y.o.  female was previously seen at this location for PT regarding plantar fascia rupture and is now presenting with L foot pain following L surgical procedure (See above) on 10/18/2023.  She reports that since the surgical date she has difficulty flexing her 4th and 5th digit. She still has difficulty with showering, walking and accepting full weight bearing on her R foot. Currently ambulating CAM Boot; once in a while she will use a knee scooter for ambulation. Relieving factors: Ice modalities, elevation   She denies numbness and tingling, nausea, fever and parasthesia.  Imaging:   PAIN:    Pain Intensity: Present: 3-4/10, Best: 0/10, Worst: 5/10 Pain location: Surgical Site Pain Quality: intermittent, dull, and aching  Radiating: No  Swelling: Yes  Focal Weakness: No Typical footwear:   PRECAUTIONS: Fall  WEIGHT BEARING RESTRICTIONS:  Per Dr. Silva:  Restart physical therapy twice per week for 6 to 8 weeks no restrictions on weightbearing focus on strengthening conditioning manual tissue manipulation.  May be out of boot for weightbearing as tolerated   FALLS: Has patient fallen in last 6 months? 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  Recent memory is intact.  Remote memory is intact.  Attention span and concentration are intact.  Expressive speech is intact.  Patient's fund of knowledge is within normal limits for educational level.    Gross Musculoskeletal Assessment Bulk: Normal Tone:  Normal No trophic changes noted to foot/ankle. No ecchymosis, erythema, or edema noted. No gross ankle/foot deformity noted  GAIT: Distance walked: 53m Assistive device utilized: Knee Scooter, [employer] Level of assistance: Modified independence Comments: No excessive pronation/supination noted during gait. No excessive shoe wear noted on medial or lateral surfaces  Posture:  AROM AROM (Normal range in degrees) AROM   Lumbar   Flexion (65)   Extension (30)   Right lateral flexion (25)   Left lateral flexion (25)   Right rotation (30)   Left rotation (30)       Hip Right Left  Flexion (125)    Extension (15)    Abduction (40)    Adduction     Internal Rotation (45)    External Rotation (45)        Knee    Flexion (135)    Extension (0)        Ankle    Dorsiflexion (20) 10 deg 7 deg  Plantarflexion (50) 20 deg 10 deg  Inversion (35)    Eversion (15)    (* = pain; Blank rows = not tested)   LE MMT: MMT (out of 5) Right  Left   Hip flexion 4- 4-  Hip extension    Hip abduction    Hip adduction    Hip internal rotation    Hip external rotation    Knee flexion 4- 4-  Knee extension 4 4  Ankle dorsiflexion 4+ Not Tested  Ankle plantarflexion 4+ Not Tested  Ankle inversion    Ankle eversion    (* = pain; Blank rows = not tested)  Sensation Grossly intact to light touch throughout bilateral LEs as determined by testing dermatomes L2-S2. Proprioception, stereognosis, and hot/cold testing deferred on this date.  Reflexes R/L Knee Jerk (L3/4): 2+/2+  Ankle Jerk (S1/2): 2+/2+   Muscle Length Hamstrings: R: Not examined L: Not examined  Palpation Location LEFT  RIGHT           Calcaneus 0   Achilles Tendon 0   Flexor Digitorum Tendon  2   Plantar Fascia 2   Peroneal Tendon 2   (Blank rows =  not tested) Graded on 0-4 scale (0 = no pain, 1 = pain, 2 = pain with wincing/grimacing/flinching, 3 = pain with withdrawal, 4 = unwilling to allow palpation),  (Blank rows = not tested)  Passive Accessory Motion Deferred  SPECIAL TESTS  Achilles Integrity Thompson Test: Negative   TODAY'S TREATMENT: DATE: 11/13/23  Therapeutic Exercise (15 min unbilled):   Review Initial  with Pt return demo:    Ankle Circles     2 x 10 - CW/CCW ea direction      Ankle Pumps    L: 2 x 10     Seated Heel Raises    1 x 10     Standing Weight Shifting onto L foot:    1 x 10 - BUE support     Figure 4 Ankle Inversion     L: 1 x 10 - YTB     Seated Eversion     L: 1 x 10  - YTB     PATIENT EDUCATION:  Education details: HEP; Prognosis; POC  Person educated: Patient Education method: Explanation and Handouts Education comprehension: verbalized understanding and returned demonstration   HOME EXERCISE PROGRAM:  Access Code: [email or web address] URL: [email or web address] Date: 11/13/2023 Prepared by: Lonni Pall  Exercises - Seated Ankle Circles  - 2 x daily - 7 x weekly - 2-3 sets - 15-20 reps - Seated Ankle Pumps  - 2 x daily - 7 x weekly - 2-3 sets - 12-15 reps - 5s hold - Towel Scrunches  - 1 x daily - 7 x weekly - 2-3 sets - 10-12 reps - Seated Figure 4 Ankle Inversion with Resistance  - 1 x daily - 7 x weekly - 2-3 sets - 10-12 reps - 5s hold - Seated Heel Raise  - 1 x daily - 7 x weekly - 2-3 sets - 10-12 reps - 5s hold - Seated Ankle Eversion with Resistance  - 1 x daily - 7 x weekly - 2-3 sets - 10-12 reps - 5s hold - Seated Long Arc Quad  - 1 x daily - 7 x weekly - 2-3 sets - 12-15 reps - 5s hold - Supine Active Straight Leg Raise  - 1 x daily - 7 x weekly - 2-3 sets - 10-12 reps - 3s hold - Sidelying Hip Abduction  - 1 x daily - 7 x weekly - 2-3 sets - 10-12 reps - 3s hold  ASSESSMENT:  CLINICAL IMPRESSION: Celestine Prim is a 43 y.o. female who was seen today for physical therapy evaluation and treatment for management of L foot pain following surgical intervention. Patient completely independent with ability to walk  with CAM boot however gait pattern presenting with decreased weight bearing on LLE and dereased velocity. Decreased LE strength bilaterally (L > R); deferred L ankle DF/PF due to pain. Palpation significant for tenderness along medial/lateral malleolus and plantar aspect of L foot. Observation with proper healing along surgical site. Today she tolerated HEP interventions focused on open chain ankle strengthening and mobility. She demonstrated good ability to perform partial weight shifting without CAM boot in order to introduce weight bearing in L foot. Patient self reported 37/80 on LEFS  (80/80 = no disability) indicating moderate functional limitations with daily activities, work and recreational tasks. PT advised patient to speak to referring provider regarding work order for standing accomodation. APatient will benefit from skilled PT interventions in order to facilitate return to PLOF, decrease risk of falls and maximize safe discharge to work related tasks.  OBJECTIVE IMPAIRMENTS: Abnormal gait, decreased activity tolerance, decreased balance, decreased endurance, difficulty walking, decreased ROM, decreased strength, hypomobility, and pain.   ACTIVITY LIMITATIONS: standing, squatting, stairs, transfers, and bathing  PARTICIPATION LIMITATIONS: occupation  PERSONAL FACTORS: Past/current experiences, Time since onset of injury/illness/exacerbation, and 1-2 comorbidities: anxiety, s/p ankle sx are also affecting patient's functional outcome.   REHAB POTENTIAL: Good  CLINICAL DECISION MAKING: Evolving/moderate complexity  EVALUATION COMPLEXITY: Moderate   GOALS: Goals reviewed with patient? Yes  SHORT TERM GOALS: Target date: 12/11/2023  Pt will be independent with HEP to improve strength and decrease ankle pain to improve pain-free function at home and work. Baseline: Initial HEP provided  Goal status: INITIAL   LONG TERM GOALS: Target date: 01/08/2024  Pt will increase LEFS by at  least 9 points in order to demonstrate significant improvement in lower extremity function. Baseline: 11/11/23: 37 / 80 = 46.3 % Goal status: INITIAL  2.  Pt will decrease worst ankle pain by at least 3 points on the NPRS in order to demonstrate clinically significant reduction in ankle pain. Baseline: 11/11/23: 5/10 NPS Goal status: INITIAL  3. Patient will be able to safely navigate a flight of 6 stairs, using proper foot placement, without handrail support, and without requiring assistance from PT in order to demonstrate significant improvement in LE strength and safety. Baseline: 11/11/23: Step to pattern - BUE Rail  Goal status: INITIAL  4.  Pt will increase strength of L Ankle DF/PF by at least 1/2 MMT grade in order to demonstrate improvement in strength and function. Baseline: 11/11/23: TBD  Goal status: INITIAL  5.  Pt will increase AROM of L Ankle PF by at least 10 deg pain free in order to demonstrate improvement in pain and ankle mobility for normalized gait pattern. Baseline: 11/11/23: 10 deg Goal status: INITIAL  6. Pt will increase by at least 0.13 m/s with LRAD in order to demonstrate clinically significant improvement in community ambulation.  Baseline: 11/11/23: .62 m/s, CAM Boot  Goal status: INITIAL  PLAN: PT FREQUENCY: 1-2x/week  PT DURATION: 12 weeks  PLANNED INTERVENTIONS: Therapeutic exercises, Therapeutic activity, Neuromuscular re-education, Balance training, Gait training, Patient/Family education, Self Care, Joint mobilization, Joint manipulation, Vestibular training, Canalith repositioning, Orthotic/Fit training, DME instructions, Dry Needling, Electrical stimulation, Spinal manipulation, Spinal mobilization, Cryotherapy, Moist heat, Taping, Traction, Ultrasound, Ionotophoresis 4mg /ml Dexamethasone , Manual therapy, and Re-evaluation.  PLAN FOR NEXT SESSION: Review HEP; Initiate ankle mobility, Weight Bearing Progression, Ankle Strengthening, Knee/hip  Strengthening   Lonni Pall PT, DPT Physical Therapist- [place]  11/13/2023, 12:30 PM

## 2023-11-19 ENCOUNTER — Ambulatory Visit

## 2023-11-20 ENCOUNTER — Telehealth: Payer: Self-pay | Admitting: Podiatry

## 2023-11-20 ENCOUNTER — Other Ambulatory Visit: Payer: Self-pay | Admitting: Podiatry

## 2023-11-20 NOTE — Telephone Encounter (Signed)
 cld pt bk and adv what dr. Silva advised. will email lidl and pt and the website dr suggested for brace if she has to wear safety shoe

## 2023-11-20 NOTE — Telephone Encounter (Signed)
 recd forms from pt. She is still in pain and may lose her job. She's on feet7 hours daily, up/down stairs on concrete floor. no way she can wear safety shoes, still in boot. sending mess to Dr for her pt very upset

## 2023-11-21 ENCOUNTER — Ambulatory Visit
Admission: RE | Admit: 2023-11-21 | Discharge: 2023-11-21 | Disposition: A | Discharge status: Home / Self Care | Source: Ambulatory Visit | Attending: Obstetrics and Gynecology | Admitting: Obstetrics and Gynecology

## 2023-11-21 ENCOUNTER — Ambulatory Visit: Payer: Self-pay | Admitting: Obstetrics and Gynecology

## 2023-11-21 ENCOUNTER — Ambulatory Visit: Attending: Podiatry

## 2023-11-21 DIAGNOSIS — R921 Mammographic calcification found on diagnostic imaging of breast: Secondary | ICD-10-CM

## 2023-11-21 DIAGNOSIS — Z1231 Encounter for screening mammogram for malignant neoplasm of breast: Secondary | ICD-10-CM

## 2023-11-21 DIAGNOSIS — R928 Other abnormal and inconclusive findings on diagnostic imaging of breast: Secondary | ICD-10-CM | POA: Insufficient documentation

## 2023-11-21 DIAGNOSIS — M25675 Stiffness of left foot, not elsewhere classified: Secondary | ICD-10-CM | POA: Insufficient documentation

## 2023-11-21 DIAGNOSIS — S93692A Other sprain of left foot, initial encounter: Secondary | ICD-10-CM | POA: Insufficient documentation

## 2023-11-21 DIAGNOSIS — M6281 Muscle weakness (generalized): Secondary | ICD-10-CM | POA: Diagnosis not present

## 2023-11-21 DIAGNOSIS — M79672 Pain in left foot: Secondary | ICD-10-CM | POA: Insufficient documentation

## 2023-11-21 DIAGNOSIS — R2689 Other abnormalities of gait and mobility: Secondary | ICD-10-CM | POA: Diagnosis not present

## 2023-11-21 NOTE — Therapy (Signed)
 OUTPATIENT PHYSICAL THERAPY ANKLE TREATMENT   Patient Name: Maria Sandoval MRN: [id number] DOB:11-Aug-1980, 43 y.o., female Today's Date: 11/21/2023  END OF SESSION:  PT End of Session - 11/21/23 1037     Visit Number 2    Number of Visits 17    Date for PT Re-Evaluation 01/08/24    Authorization Type [employer] auth#[id number] for 10 PT vsts from 8/27-10/19    Authorization - Visit Number 1    Authorization - Number of Visits 10    PT Start Time 1037    PT Stop Time 1115    PT Time Calculation (min) 38 min    Activity Tolerance Patient tolerated treatment well    Behavior During Therapy [facility] for tasks assessed/performed          Past Medical History:  Diagnosis Date   Anxiety disorder    Past Surgical History:  Procedure Laterality Date   ANKLE SURGERY Right    Patient Active Problem List   Diagnosis Date Noted   Plantar fasciitis of left foot 03/18/2023   Pure hypercholesterolemia 03/18/2023   Eczema 03/18/2023   Anxiety and depression 03/18/2023   Class 1 obesity due to excess calories with body mass index (BMI) of 32.0 to 32.9 in adult 03/18/2023    PCP: Antonette Angeline ORN, NP  REFERRING PROVIDER:  Silva Juliene SAUNDERS, DPM      REFERRING DIAG:  [phone] (ICD-10-CM) - Tenosynovitis of tibialis posterior tendon  M65.979 (ICD-10-CM) - Peroneal tenosynovitis  D06.307J (ICD-10-CM) - Rupture of plantar fascia of left foot, initial encounter    Rationale for Evaluation and Treatment: Rehabilitation  THERAPY DIAG: Pain in left foot  Other abnormalities of gait and mobility  Muscle weakness (generalized)  Stiffness of left foot, not elsewhere classified  ONSET DATE: 10/18/2023  FOLLOW-UP APPT SCHEDULED WITH REFERRING PROVIDER: Yes  SUBJECTIVE:                                                                                                                                                                                         SUBJECTIVE STATEMENT:    Patient  reports to OPPT with a chief concern of L ankle pain following: LT EPF W PRP, LT TENOLYSIS POSTERIOR TIBIAL AND PERONEAL TENDONS W/TOPAZ (10/18/2023)  PERTINENT HISTORY:   Maria Sandoval is a 43 y.o. female was previously seen at this location for PT regarding plantar fascia rupture and is now presenting with L foot pain following L surgical procedure (See above) on 10/18/2023.  She reports that since the surgical date she has difficulty flexing her 4th and 5th digit. She still has difficulty with showering, walking and accepting full  weight bearing on her R foot. Currently ambulating CAM Boot; once in a while she will use a knee scooter for ambulation. Relieving factors: Ice modalities, elevation   She denies numbness and tingling, nausea, fever and parasthesia.   Imaging:   PAIN:    Pain Intensity: Present: 3-4/10, Best: 0/10, Worst: 5/10 Pain location: Surgical Site Pain Quality: intermittent, dull, and aching  Radiating: No  Swelling: Yes  Focal Weakness: No Typical footwear:   PRECAUTIONS: Fall  WEIGHT BEARING RESTRICTIONS:  Per Dr. Silva:  Restart physical therapy twice per week for 6 to 8 weeks no restrictions on weightbearing focus on strengthening conditioning manual tissue manipulation.  May be out of boot for weightbearing as tolerated   FALLS: Has patient fallen in last 6 months? 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  Recent memory is intact.  Remote memory is intact.  Attention  span and concentration are intact.  Expressive speech is intact.  Patient's fund of knowledge is within normal limits for educational level.    Gross Musculoskeletal Assessment Bulk: Normal Tone: Normal No trophic changes noted to foot/ankle. No ecchymosis, erythema, or edema noted. No gross ankle/foot deformity noted  GAIT: Distance walked: 47m Assistive device utilized: Knee Scooter, [employer] Level of assistance: Modified independence Comments: No excessive pronation/supination noted during gait. No excessive shoe wear noted on medial or lateral surfaces  Posture:  AROM AROM (Normal range in degrees) AROM   Lumbar   Flexion (65)   Extension (30)   Right lateral flexion (25)   Left lateral flexion (25)   Right rotation (30)   Left rotation (30)       Hip Right Left  Flexion (125)    Extension (15)    Abduction (40)    Adduction     Internal Rotation (45)    External Rotation (45)        Knee    Flexion (135)    Extension (0)        Ankle    Dorsiflexion (20) 10 deg 7 deg  Plantarflexion (50) 20 deg 10 deg  Inversion (35)    Eversion (15)    (* = pain; Blank rows = not tested)   LE MMT: MMT (out of 5) Right  Left   Hip flexion 4- 4-  Hip extension    Hip abduction    Hip adduction    Hip internal rotation    Hip external rotation    Knee flexion 4- 4-  Knee extension 4 4  Ankle dorsiflexion 4+ Not Tested  Ankle plantarflexion 4+ Not Tested  Ankle inversion    Ankle eversion    (* = pain; Blank rows = not tested)  Sensation Grossly intact to light touch throughout bilateral LEs as determined by testing dermatomes L2-S2. Proprioception, stereognosis, and hot/cold testing deferred on this date.  Reflexes R/L Knee Jerk (L3/4): 2+/2+  Ankle Jerk (S1/2): 2+/2+   Muscle Length Hamstrings: R: Not examined L: Not examined  Palpation Location LEFT  RIGHT           Calcaneus 0   Achilles Tendon 0   Flexor Digitorum Tendon  2   Plantar Fascia 2    Peroneal Tendon 2   (Blank rows = not tested) Graded on 0-4 scale (0 = no pain, 1 = pain, 2 = pain with wincing/grimacing/flinching, 3 = pain with withdrawal, 4 = unwilling to allow palpation), (Blank rows = not tested)  Passive Accessory Motion Deferred  SPECIAL TESTS  Achilles Integrity Thompson Test: Negative   TODAY'S TREATMENT: DATE: 11/21/23  Subjective: Patient reports a popping sensation and a bulging sensation while she walks around the house.   Therapeutic Exercise:   Review Initial  with Pt return demo:    Ankle Circles     2 x 10 - CW/CCW ea direction    Seated Heel Raises    2 x 10 x 3s hold   PT education on proper exercises to perform throughout the weekend in order to maintain proximal strength. Encouraged ankle ROM in order to reduce stiffness in RLE    Neuromuscular Re-education:   Ankle PF/DF/Inv/Ever on balance pad    1 x 15 ea direction - CW/CCW   Shoes Donned:   Standing Weight Shifting onto LLE for kinesthesia and joint proprioception of L ankle  2 x 10 - 1st set with BUE     Side Stepping with Hurry Cane for kinesthesia and balance     R/L Direction      3 x 5 ea way   Gait Training:   Shoes Donned:    Forward Walking with [employer]: 200 ft total - SBA in order for safety    - Step To Pattern progressed Step through pattern - VC for proper step length with L foot and follow through with RLE  - two instances of minor imbalance requiring minA from PT to maintain balance    - intermittent standing rest breaks in order to mitigate soreness/fatigue  PATIENT EDUCATION:  Education details: Exercise Technique, POC   Person educated: Patient Education method: Explanation and Handouts Education comprehension: verbalized understanding and returned demonstration   HOME EXERCISE PROGRAM:  Access Code: [email or web address] URL: [email or web address] Date: 11/13/2023 Prepared by: Lonni Pall  Exercises - Seated Ankle Circles  - 2 x  daily - 7 x weekly - 2-3 sets - 15-20 reps - Seated Ankle Pumps  - 2 x daily - 7 x weekly - 2-3 sets - 12-15 reps - 5s hold - Towel Scrunches  - 1 x daily - 7 x weekly - 2-3 sets - 10-12 reps - Seated Figure 4 Ankle Inversion with Resistance  - 1 x daily - 7 x weekly - 2-3 sets - 10-12 reps - 5s hold - Seated Heel Raise  - 1 x daily - 7 x weekly - 2-3 sets - 10-12 reps - 5s hold - Seated Ankle Eversion with Resistance  - 1 x daily - 7 x weekly - 2-3 sets - 10-12 reps - 5s hold - Seated Long Arc Quad  - 1 x daily - 7 x weekly - 2-3 sets - 12-15 reps - 5s hold - Supine Active Straight Leg Raise  - 1 x daily - 7 x weekly - 2-3 sets - 10-12 reps - 3s hold - Sidelying Hip Abduction  - 1 x daily - 7 x weekly - 2-3 sets - 10-12 reps - 3s hold  ASSESSMENT:  CLINICAL IMPRESSION: Patient returned to OPPT for first follow up. She  was able to progress from CAM boot to level surface gait using hurry cane and personal shoe. She tolerated all interventions well without exacerbation of ankle pain. Some instances of stepping reaction or minA from PT in order to maintain balance with full weight shifting onto L foot. She continues to be limited with ROM, strength, balance limiting her full participation with work related tasks and recreational activities. Patient will benefit from skilled PT interventions in order to facilitate return to PLOF, decrease risk of falls and maximize safe discharge to work related tasks.   OBJECTIVE IMPAIRMENTS: Abnormal gait, decreased activity tolerance, decreased balance, decreased endurance, difficulty walking, decreased ROM, decreased strength, hypomobility, and pain.   ACTIVITY LIMITATIONS: standing, squatting, stairs, transfers, and bathing  PARTICIPATION LIMITATIONS: occupation  PERSONAL FACTORS: Past/current experiences, Time since onset of injury/illness/exacerbation, and 1-2 comorbidities: anxiety, s/p ankle sx are also affecting patient's functional outcome.   REHAB  POTENTIAL: Good  CLINICAL DECISION MAKING: Evolving/moderate complexity  EVALUATION COMPLEXITY: Moderate   GOALS: Goals reviewed with patient? Yes  SHORT TERM GOALS: Target date: 12/19/2023  Pt will be independent with HEP to improve strength and decrease ankle pain to improve pain-free function at home and work. Baseline: Initial HEP provided  Goal status: INITIAL   LONG TERM GOALS: Target date: 01/16/2024  Pt will increase LEFS by at least 9 points in order to demonstrate significant improvement in lower extremity function. Baseline: 11/11/23: 37 / 80 = 46.3 % Goal status: INITIAL  2.  Pt will decrease worst ankle pain by at least 3 points on the NPRS in order to demonstrate clinically significant reduction in ankle pain. Baseline: 11/11/23: 5/10 NPS Goal status: INITIAL  3. Patient will be able to safely navigate a flight of 6 stairs, using proper foot placement, without handrail support, and without requiring assistance from PT in order to demonstrate significant improvement in LE strength and safety. Baseline: 11/11/23: Step to pattern - BUE Rail  Goal status: INITIAL  4.  Pt will increase strength of L Ankle DF/PF by at least 1/2 MMT grade in order to demonstrate improvement in strength and function. Baseline: 11/11/23: TBD  Goal status: INITIAL  5.  Pt will increase AROM of L Ankle PF by at least 10 deg pain free in order to demonstrate improvement in pain and ankle mobility for normalized gait pattern. Baseline: 11/11/23: 10 deg Goal status: INITIAL  6. Pt will increase by at least 0.13 m/s with LRAD in order to demonstrate clinically significant improvement in community ambulation.  Baseline: 11/11/23: .62 m/s, CAM Boot  Goal status: INITIAL  PLAN: PT FREQUENCY: 1-2x/week  PT DURATION: 12 weeks  PLANNED INTERVENTIONS: Therapeutic exercises, Therapeutic activity, Neuromuscular re-education, Balance training, Gait training, Patient/Family education, Self  Care, Joint mobilization, Joint manipulation, Vestibular training, Canalith repositioning, Orthotic/Fit training, DME instructions, Dry Needling, Electrical stimulation, Spinal manipulation, Spinal mobilization, Cryotherapy, Moist heat, Taping, Traction, Ultrasound, Ionotophoresis 4mg /ml Dexamethasone , Manual therapy, and Re-evaluation.  PLAN FOR NEXT SESSION: Review HEP; Initiate ankle mobility, Weight Bearing Progression, Ankle Strengthening, Knee/hip Strengthening   Lonni Pall PT, DPT Physical Therapist- [place]  11/21/2023, 12:16 PM

## 2023-11-26 ENCOUNTER — Encounter

## 2023-11-27 ENCOUNTER — Ambulatory Visit (INDEPENDENT_AMBULATORY_CARE_PROVIDER_SITE_OTHER): Admitting: Podiatry

## 2023-11-27 DIAGNOSIS — M65969 Unspecified synovitis and tenosynovitis, unspecified lower leg: Secondary | ICD-10-CM

## 2023-11-27 DIAGNOSIS — S93692A Other sprain of left foot, initial encounter: Secondary | ICD-10-CM

## 2023-11-27 NOTE — Therapy (Signed)
 OUTPATIENT PHYSICAL THERAPY ANKLE TREATMENT   Patient Name: Maria Sandoval MRN: [id number] DOB:07/30/1980, 43 y.o., female Today's Date: 11/28/2023  END OF SESSION:  PT End of Session - 11/28/23 0826     Visit Number 3    Number of Visits 17    Date for PT Re-Evaluation 01/08/24    Authorization Type [employer] auth#[id number] for 10 PT vsts from 8/27-10/19    Authorization - Number of Visits 10    PT Start Time 0825    PT Stop Time 0858    PT Time Calculation (min) 33 min    Activity Tolerance Patient tolerated treatment well    Behavior During Therapy [facility] for tasks assessed/performed           Past Medical History:  Diagnosis Date   Anxiety disorder    Past Surgical History:  Procedure Laterality Date   ANKLE SURGERY Right    Patient Active Problem List   Diagnosis Date Noted   Plantar fasciitis of left foot 03/18/2023   Pure hypercholesterolemia 03/18/2023   Eczema 03/18/2023   Anxiety and depression 03/18/2023   Class 1 obesity due to excess calories with body mass index (BMI) of 32.0 to 32.9 in adult 03/18/2023    PCP: Antonette Angeline ORN, NP  REFERRING PROVIDER:  Silva Juliene SAUNDERS, DPM      REFERRING DIAG:  [id number] (ICD-10-CM) - Tenosynovitis of tibialis posterior tendon  M65.979 (ICD-10-CM) - Peroneal tenosynovitis  D06.307J (ICD-10-CM) - Rupture of plantar fascia of left foot, initial encounter    Rationale for Evaluation and Treatment: Rehabilitation  THERAPY DIAG: Pain in left foot  Other abnormalities of gait and mobility  Muscle weakness (generalized)  Stiffness of left foot, not elsewhere classified  Rupture of plantar fascia of left foot, initial encounter  ONSET DATE: 10/18/2023  FOLLOW-UP APPT SCHEDULED WITH REFERRING PROVIDER: Yes  SUBJECTIVE:                                                                                                                                                                                          SUBJECTIVE STATEMENT:    Patient reports to OPPT with a chief concern of L ankle pain following: LT EPF W PRP, LT TENOLYSIS POSTERIOR TIBIAL AND PERONEAL TENDONS W/TOPAZ (10/18/2023)  PERTINENT HISTORY:   Maria Sandoval is a 43 y.o. female was previously seen at this location for PT regarding plantar fascia rupture and is now presenting with L foot pain following L surgical procedure (See above) on 10/18/2023.  She reports that since the surgical date she has difficulty flexing her 4th and 5th digit. She still has difficulty with showering, walking  and accepting full weight bearing on her R foot. Currently ambulating CAM Boot; once in a while she will use a knee scooter for ambulation. Relieving factors: Ice modalities, elevation   She denies numbness and tingling, nausea, fever and parasthesia.   Imaging:   PAIN:    Pain Intensity: Present: 3-4/10, Best: 0/10, Worst: 5/10 Pain location: Surgical Site Pain Quality: intermittent, dull, and aching  Radiating: No  Swelling: Yes  Focal Weakness: No Typical footwear:   PRECAUTIONS: Fall  WEIGHT BEARING RESTRICTIONS:  Per Dr. Silva:  Restart physical therapy twice per week for 6 to 8 weeks no restrictions on weightbearing focus on strengthening conditioning manual tissue manipulation.  May be out of boot for weightbearing as tolerated   FALLS: Has patient fallen in last 6 months? 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  Recent memory is intact.   Remote memory is intact.  Attention span and concentration are intact.  Expressive speech is intact.  Patient's fund of knowledge is within normal limits for educational level.    Gross Musculoskeletal Assessment Bulk: Normal Tone: Normal No trophic changes noted to foot/ankle. No ecchymosis, erythema, or edema noted. No gross ankle/foot deformity noted  GAIT: Distance walked: 83m Assistive device utilized: Knee Scooter, [employer] Level of assistance: Modified independence Comments: No excessive pronation/supination noted during gait. No excessive shoe wear noted on medial or lateral surfaces  Posture:  AROM AROM (Normal range in degrees) AROM   Lumbar   Flexion (65)   Extension (30)   Right lateral flexion (25)   Left lateral flexion (25)   Right rotation (30)   Left rotation (30)       Hip Right Left  Flexion (125)    Extension (15)    Abduction (40)    Adduction     Internal Rotation (45)    External Rotation (45)        Knee    Flexion (135)    Extension (0)        Ankle    Dorsiflexion (20) 10 deg 7 deg  Plantarflexion (50) 20 deg 10 deg  Inversion (35)    Eversion (15)    (* = pain; Blank rows = not tested)   LE MMT: MMT (out of 5) Right  Left   Hip flexion 4- 4-  Hip extension    Hip abduction    Hip adduction    Hip internal rotation    Hip external rotation    Knee flexion 4- 4-  Knee extension 4 4  Ankle dorsiflexion 4+ Not Tested  Ankle plantarflexion 4+ Not Tested  Ankle inversion    Ankle eversion    (* = pain; Blank rows = not tested)  Sensation Grossly intact to light touch throughout bilateral LEs as determined by testing dermatomes L2-S2. Proprioception, stereognosis, and hot/cold testing deferred on this date.  Reflexes R/L Knee Jerk (L3/4): 2+/2+  Ankle Jerk (S1/2): 2+/2+   Muscle Length Hamstrings: R: Not examined L: Not examined  Palpation Location LEFT  RIGHT           Calcaneus 0   Achilles Tendon 0   Flexor  Digitorum Tendon  2   Plantar Fascia 2   Peroneal Tendon 2   (Blank rows = not tested) Graded on 0-4 scale (0 = no pain, 1 = pain, 2 = pain with wincing/grimacing/flinching, 3 = pain with withdrawal, 4 = unwilling to allow palpation), (Blank rows = not tested)  Passive Accessory Motion Deferred  SPECIAL TESTS  Achilles Integrity Thompson Test: Negative   TODAY'S TREATMENT: DATE: 11/28/23   Subjective: Patient reports after 30 minutes of being out of the boot, her foot is super painful and swollen. Unable to tolerate more than 30 minutes.   Therapeutic Exercise:  Seated heel raises 2 x 10 with 3 second hold  Seated toe raises 2 x 10 with 3 second hold    Seated ankle circles CW/CCW on dynadisch 2 x 15 each direction    Long sitting toe spreading on L foot x 20  Long sitting toe curl/toe extension on L foot x 20    PROM L plantar fascia 6 x 5 second holds   PT education on proper exercises to perform throughout the weekend in order to maintain proximal strength. Encouraged ankle ROM in order to reduce stiffness in RLE  Manual:   STM to L plantar fascia with therapist placing fascia on slight stretch intermittently x 10 minutes    PATIENT EDUCATION:  Education details: Exercise Technique, POC   Person educated: Patient Education method: Explanation and Handouts Education comprehension: verbalized understanding and returned demonstration   HOME EXERCISE PROGRAM:  Access Code: [email or web address] URL: [email or web address] Date: 11/13/2023 Prepared by: Lonni Pall  Exercises - Seated Ankle Circles  - 2 x daily - 7 x weekly - 2-3 sets - 15-20 reps - Seated Ankle Pumps  - 2 x daily - 7 x weekly - 2-3 sets - 12-15 reps - 5s hold - Towel Scrunches  - 1 x daily - 7 x weekly - 2-3 sets - 10-12 reps - Seated Figure 4 Ankle Inversion with Resistance  - 1 x daily - 7 x weekly - 2-3 sets - 10-12 reps - 5s hold - Seated Heel Raise  - 1 x daily - 7 x weekly - 2-3 sets -  10-12 reps - 5s hold - Seated Ankle Eversion with Resistance  - 1 x daily - 7 x weekly - 2-3 sets - 10-12 reps - 5s hold - Seated Long Arc Quad  - 1 x daily - 7 x weekly - 2-3 sets - 12-15 reps - 5s hold - Supine Active Straight Leg Raise  - 1 x daily - 7 x weekly - 2-3 sets - 10-12 reps - 3s hold - Sidelying Hip Abduction  - 1 x daily - 7 x weekly - 2-3 sets - 10-12 reps - 3s hold  Access Code: [email or web address] URL: [email or web address] Date: 11/28/2023 Prepared by: Maryanne Finder  Exercises - Toe Spreading  - 2-3 x daily - 5-7 x weekly - 20 reps - Seated Toe Curl  - 2-3 x daily - 5-7 x weekly - 20 reps - Seated Toe Flexion Extension PROM  - 2-3 x daily - 5-7 x weekly - 10 reps - 5-10 second hold  ASSESSMENT:  CLINICAL IMPRESSION:   Patient arrives to PT appointment 10 minutes late but motivated to participate. Did not bring her regular shoe this session. Session focused on intrinsic foot strengthening and mobility  along with manual soft tissue mobilization to improve flexibility. Tolerated well with no increase in pain. She continues to be limited with ROM, strength, balance limiting her full participation with work related tasks and recreational activities. Patient will benefit from skilled PT interventions in order to facilitate return to PLOF, decrease risk of falls and maximize safe discharge to work related tasks.   OBJECTIVE IMPAIRMENTS: Abnormal gait, decreased activity tolerance, decreased balance, decreased endurance, difficulty walking, decreased ROM, decreased strength, hypomobility, and pain.   ACTIVITY LIMITATIONS: standing, squatting, stairs, transfers, and bathing  PARTICIPATION LIMITATIONS: occupation  PERSONAL FACTORS: Past/current experiences, Time since onset of injury/illness/exacerbation, and 1-2 comorbidities: anxiety, s/p ankle sx are also affecting patient's functional outcome.   REHAB POTENTIAL: Good  CLINICAL DECISION MAKING: Evolving/moderate  complexity  EVALUATION COMPLEXITY: Moderate   GOALS: Goals reviewed with patient? Yes  SHORT TERM GOALS: Target date: 12/26/2023  Pt will be independent with HEP to improve strength and decrease ankle pain to improve pain-free function at home and work. Baseline: Initial HEP provided  Goal status: INITIAL   LONG TERM GOALS: Target date: 01/23/2024  Pt will increase LEFS by at least 9 points in order to demonstrate significant improvement in lower extremity function. Baseline: 11/11/23: 37 / 80 = 46.3 % Goal status: INITIAL  2.  Pt will decrease worst ankle pain by at least 3 points on the NPRS in order to demonstrate clinically significant reduction in ankle pain. Baseline: 11/11/23: 5/10 NPS Goal status: INITIAL  3. Patient will be able to safely navigate a flight of 6 stairs, using proper foot placement, without handrail support, and without requiring assistance from PT in order to demonstrate significant improvement in LE strength and safety. Baseline: 11/11/23: Step to pattern - BUE Rail  Goal status: INITIAL  4.  Pt will increase strength of L Ankle DF/PF by at least 1/2 MMT grade in order to demonstrate improvement in strength and function. Baseline: 11/11/23: TBD  Goal status: INITIAL  5.  Pt will increase AROM of L Ankle PF by at least 10 deg pain free in order to demonstrate improvement in pain and ankle mobility for normalized gait pattern. Baseline: 11/11/23: 10 deg Goal status: INITIAL  6. Pt will increase by at least 0.13 m/s with LRAD in order to demonstrate clinically significant improvement in community ambulation.  Baseline: 11/11/23: .62 m/s, CAM Boot  Goal status: INITIAL  PLAN: PT FREQUENCY: 1-2x/week  PT DURATION: 12 weeks  PLANNED INTERVENTIONS: Therapeutic exercises, Therapeutic activity, Neuromuscular re-education, Balance training, Gait training, Patient/Family education, Self Care, Joint mobilization, Joint manipulation, Vestibular training,  Canalith repositioning, Orthotic/Fit training, DME instructions, Dry Needling, Electrical stimulation, Spinal manipulation, Spinal mobilization, Cryotherapy, Moist heat, Taping, Traction, Ultrasound, Ionotophoresis 4mg /ml Dexamethasone , Manual therapy, and Re-evaluation.  PLAN FOR NEXT SESSION: Review HEP; Initiate ankle mobility, Weight Bearing Progression, Ankle Strengthening, Knee/hip Strengthening  Maryanne Finder, PT, DPT Physical Therapist - [place]  [facility] 11/28/2023, 8:26 AM

## 2023-11-27 NOTE — Progress Notes (Signed)
  Subjective:  Patient ID: Maria Sandoval, female    DOB: 03/14/1981,  MRN: [id number]  Chief Complaint  Patient presents with   Foot Pain    PO left foot. PT going well.      43 y.o. female returns for post-op check.  She has had some improvement with therapy but is still dependent on the boot.  Says she can only be in her shoe for about 30 minutes at a time right now Review of Systems: Negative except as noted in the HPI. Denies N/V/F/Ch.   Objective:  There were no vitals filed for this visit. There is no height or weight on file to calculate BMI. Constitutional Well developed. Well nourished.  Vascular Foot warm and well perfused. Capillary refill normal to all digits.  Calf is soft and supple, no posterior calf or knee pain, negative Homans' sign  Neurologic Normal speech. Oriented to person, place, and time. Epicritic sensation to light touch grossly present bilaterally.  Dermatologic Skin is healed well without hypertrophy or tenderness  Orthopedic: Mild tenderness to palpation noted about the surgical site.    Assessment:   1. Tenosynovitis of tibialis posterior tendon   2. Rupture of plantar fascia of left foot, initial encounter     Plan:  Patient was evaluated and treated and all questions answered.  S/p foot surgery left - Have improved with physical therapy.  All as far as her therapy and recovery have neurosurgeons for her on her weightbearing she may transition to shoe gear gradually.  It is still too early for her to be able to be on her feet for her full work duties and regular shoe gear yet.  I will reevaluate her in 3 weeks and get further updates at that point but likely will not be until early or mid October until she is able to fully return to work.  Continue PT.  Refill of tramadol  was sent in attempt encounter for pain control.  Return in about 3 weeks (around 12/18/2023) for post op (no x-rays).

## 2023-11-27 NOTE — Telephone Encounter (Signed)
 Pt lft mess about last email. I emailed her back as to what came bk to me from [facility] adv email was recd.

## 2023-11-28 ENCOUNTER — Ambulatory Visit

## 2023-11-28 DIAGNOSIS — M79672 Pain in left foot: Secondary | ICD-10-CM

## 2023-11-28 DIAGNOSIS — R2689 Other abnormalities of gait and mobility: Secondary | ICD-10-CM | POA: Diagnosis not present

## 2023-11-28 DIAGNOSIS — M6281 Muscle weakness (generalized): Secondary | ICD-10-CM

## 2023-11-28 DIAGNOSIS — M25675 Stiffness of left foot, not elsewhere classified: Secondary | ICD-10-CM | POA: Diagnosis not present

## 2023-11-28 DIAGNOSIS — S93692A Other sprain of left foot, initial encounter: Secondary | ICD-10-CM | POA: Diagnosis not present

## 2023-11-29 DIAGNOSIS — Z419 Encounter for procedure for purposes other than remedying health state, unspecified: Secondary | ICD-10-CM | POA: Diagnosis not present

## 2023-12-02 NOTE — Therapy (Signed)
 OUTPATIENT PHYSICAL THERAPY ANKLE TREATMENT   Patient Name: Maria Sandoval MRN: [id number] DOB:23-Feb-1981, 43 y.o., female Today's Date: 12/03/2023  END OF SESSION:  PT End of Session - 12/03/23 1112     Visit Number 4    Number of Visits 17    Date for PT Re-Evaluation 01/08/24    Authorization Type [facility] auth#[id number] for 10 PT vsts from 8/27-10/19    Authorization - Number of Visits 10    PT Start Time 1115    PT Stop Time 1156    PT Time Calculation (min) 41 min    Activity Tolerance Patient tolerated treatment well    Behavior During Therapy [facility] for tasks assessed/performed            Past Medical History:  Diagnosis Date   Anxiety disorder    Past Surgical History:  Procedure Laterality Date   ANKLE SURGERY Right    Patient Active Problem List   Diagnosis Date Noted   Plantar fasciitis of left foot 03/18/2023   Pure hypercholesterolemia 03/18/2023   Eczema 03/18/2023   Anxiety and depression 03/18/2023   Class 1 obesity due to excess calories with body mass index (BMI) of 32.0 to 32.9 in adult 03/18/2023    PCP: Antonette Angeline ORN, NP  REFERRING PROVIDER:  Silva Juliene SAUNDERS, DPM      REFERRING DIAG:  [phone] (ICD-10-CM) - Tenosynovitis of tibialis posterior tendon  M65.979 (ICD-10-CM) - Peroneal tenosynovitis  D06.307J (ICD-10-CM) - Rupture of plantar fascia of left foot, initial encounter    Rationale for Evaluation and Treatment: Rehabilitation  THERAPY DIAG: Pain in left foot  Other abnormalities of gait and mobility  Muscle weakness (generalized)  Stiffness of left foot, not elsewhere classified  Rupture of plantar fascia of left foot, initial encounter  ONSET DATE: 10/18/2023  FOLLOW-UP APPT SCHEDULED WITH REFERRING PROVIDER: Yes  SUBJECTIVE:                                                                                                                                                                                          SUBJECTIVE STATEMENT:    Patient reports to OPPT with a chief concern of L ankle pain following: LT EPF W PRP, LT TENOLYSIS POSTERIOR TIBIAL AND PERONEAL TENDONS W/TOPAZ (10/18/2023)  PERTINENT HISTORY:   Maria Sandoval is a 43 y.o. female was previously seen at this location for PT regarding plantar fascia rupture and is now presenting with L foot pain following L surgical procedure (See above) on 10/18/2023.  She reports that since the surgical date she has difficulty flexing her 4th and 5th digit. She still has difficulty with showering,  walking and accepting full weight bearing on her R foot. Currently ambulating CAM Boot; once in a while she will use a knee scooter for ambulation. Relieving factors: Ice modalities, elevation   She denies numbness and tingling, nausea, fever and parasthesia.   Imaging:   PAIN:    Pain Intensity: Present: 3-4/10, Best: 0/10, Worst: 5/10 Pain location: Surgical Site Pain Quality: intermittent, dull, and aching  Radiating: No  Swelling: Yes  Focal Weakness: No Typical footwear:   PRECAUTIONS: Fall  WEIGHT BEARING RESTRICTIONS:  Per Dr. Silva:  Restart physical therapy twice per week for 6 to 8 weeks no restrictions on weightbearing focus on strengthening conditioning manual tissue manipulation.  May be out of boot for weightbearing as tolerated   FALLS: Has patient fallen in last 6 months? 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  Recent memory is intact.   Remote memory is intact.  Attention span and concentration are intact.  Expressive speech is intact.  Patient's fund of knowledge is within normal limits for educational level.    Gross Musculoskeletal Assessment Bulk: Normal Tone: Normal No trophic changes noted to foot/ankle. No ecchymosis, erythema, or edema noted. No gross ankle/foot deformity noted  GAIT: Distance walked: 81m Assistive device utilized: Knee Scooter, [employer] Level of assistance: Modified independence Comments: No excessive pronation/supination noted during gait. No excessive shoe wear noted on medial or lateral surfaces  Posture:  AROM AROM (Normal range in degrees) AROM   Lumbar   Flexion (65)   Extension (30)   Right lateral flexion (25)   Left lateral flexion (25)   Right rotation (30)   Left rotation (30)       Hip Right Left  Flexion (125)    Extension (15)    Abduction (40)    Adduction     Internal Rotation (45)    External Rotation (45)        Knee    Flexion (135)    Extension (0)        Ankle    Dorsiflexion (20) 10 deg 7 deg  Plantarflexion (50) 20 deg 10 deg  Inversion (35)    Eversion (15)    (* = pain; Blank rows = not tested)   LE MMT: MMT (out of 5) Right  Left   Hip flexion 4- 4-  Hip extension    Hip abduction    Hip adduction    Hip internal rotation    Hip external rotation    Knee flexion 4- 4-  Knee extension 4 4  Ankle dorsiflexion 4+ Not Tested  Ankle plantarflexion 4+ Not Tested  Ankle inversion    Ankle eversion    (* = pain; Blank rows = not tested)  Sensation Grossly intact to light touch throughout bilateral LEs as determined by testing dermatomes L2-S2. Proprioception, stereognosis, and hot/cold testing deferred on this date.  Reflexes R/L Knee Jerk (L3/4): 2+/2+  Ankle Jerk (S1/2): 2+/2+   Muscle Length Hamstrings: R: Not examined L: Not examined  Palpation Location LEFT  RIGHT           Calcaneus 0   Achilles Tendon 0   Flexor  Digitorum Tendon  2   Plantar Fascia 2   Peroneal Tendon 2   (Blank rows = not tested) Graded on 0-4 scale (0 = no pain, 1 = pain, 2 = pain with wincing/grimacing/flinching, 3 = pain with withdrawal, 4 = unwilling to allow palpation), (Blank rows = not tested)  Passive Accessory Motion Deferred  SPECIAL TESTS  Achilles Integrity Thompson Test: Negative   TODAY'S TREATMENT: DATE: 12/03/23   Subjective: Patient reports pain in the ball of her L foot with walking in her regular shoes. Patient reports being able to tolerate 3 hours out of the boot at a time but not doing long walking while out of the boot. Work denied restrictions and unable to go back to work   Therapeutic Exercise:  Seated heel raises 2 x 10 with 3 second hold  Seated toe raises 2 x 10 with 3 second hold    Seated ankle PF/DF on dynadisc 2 x 15 each direction    Seated ankle inv/ev on dynadisc 2 x 15 each direction    Seated arch lift with shoe off 2 x 10     Seated toe splaying with shoe off 2 x 15     Seated great toe extension x 10 - difficult  Seated toe splaying x 15   Standing heel raises 2 x 15    Manual:   STM to L plantar fascia with therapist placing fascia on slight stretch intermittently x 10 minutes    PATIENT EDUCATION:  Education details: Exercise Technique, POC   Person educated: Patient Education method: Explanation and Handouts Education comprehension: verbalized understanding and returned demonstration   HOME EXERCISE PROGRAM:  Access Code: [email or web address] URL: [email or web address] Date: 11/13/2023 Prepared by: Lonni Pall  Exercises - Seated Ankle Circles  - 2 x daily - 7 x weekly - 2-3 sets - 15-20 reps - Seated Ankle Pumps  - 2 x daily - 7 x weekly - 2-3 sets - 12-15 reps - 5s hold - Towel Scrunches  - 1 x daily - 7 x weekly - 2-3 sets - 10-12 reps - Seated Figure 4 Ankle Inversion with Resistance  - 1 x daily - 7 x weekly - 2-3 sets - 10-12 reps - 5s hold -  Seated Heel Raise  - 1 x daily - 7 x weekly - 2-3 sets - 10-12 reps - 5s hold - Seated Ankle Eversion with Resistance  - 1 x daily - 7 x weekly - 2-3 sets - 10-12 reps - 5s hold - Seated Long Arc Quad  - 1 x daily - 7 x weekly - 2-3 sets - 12-15 reps - 5s hold - Supine Active Straight Leg Raise  - 1 x daily - 7 x weekly - 2-3 sets - 10-12 reps - 3s hold - Sidelying Hip Abduction  - 1 x daily - 7 x weekly - 2-3 sets - 10-12 reps - 3s hold  Access Code: [email or web address] URL: [email or web address] Date: 11/28/2023 Prepared by: Maryanne Finder  Exercises - Toe Spreading  - 2-3 x daily - 5-7 x weekly - 20 reps - Seated Toe Curl  - 2-3 x daily - 5-7 x weekly - 20 reps - Seated Toe Flexion Extension PROM  - 2-3 x daily - 5-7 x weekly - 10 reps - 5-10 second hold  ASSESSMENT:  CLINICAL IMPRESSION:  Session with continued focus on L ankle mobility and plantar fascia stretching. Complains of pain in ball of her foot during ambulation and has decreased tolerance to prolonged standing activities. She continues to be limited with ROM, strength, balance limiting her full participation with work related tasks and recreational activities. Patient will benefit from skilled PT interventions in order to facilitate return to PLOF, decrease risk of falls and maximize safe discharge to work related tasks.   OBJECTIVE IMPAIRMENTS: Abnormal gait, decreased activity tolerance, decreased balance, decreased endurance, difficulty walking, decreased ROM, decreased strength, hypomobility, and pain.   ACTIVITY LIMITATIONS: standing, squatting, stairs, transfers, and bathing  PARTICIPATION LIMITATIONS: occupation  PERSONAL FACTORS: Past/current experiences, Time since onset of injury/illness/exacerbation, and 1-2 comorbidities: anxiety, s/p ankle sx are also affecting patient's functional outcome.   REHAB POTENTIAL: Good  CLINICAL DECISION MAKING: Evolving/moderate complexity  EVALUATION COMPLEXITY:  Moderate   GOALS: Goals reviewed with patient? Yes  SHORT TERM GOALS: Target date: 12/31/2023  Pt will be independent with HEP to improve strength and decrease ankle pain to improve pain-free function at home and work. Baseline: Initial HEP provided  Goal status: INITIAL   LONG TERM GOALS: Target date: 01/28/2024  Pt will increase LEFS by at least 9 points in order to demonstrate significant improvement in lower extremity function. Baseline: 11/11/23: 37 / 80 = 46.3 % Goal status: INITIAL  2.  Pt will decrease worst ankle pain by at least 3 points on the NPRS in order to demonstrate clinically significant reduction in ankle pain. Baseline: 11/11/23: 5/10 NPS Goal status: INITIAL  3. Patient will be able to safely navigate a flight of 6 stairs, using proper foot placement, without handrail support, and without requiring assistance from PT in order to demonstrate significant improvement in LE strength and safety. Baseline: 11/11/23: Step to pattern - BUE Rail  Goal status: INITIAL  4.  Pt will increase strength of L Ankle DF/PF by at least 1/2 MMT grade in order to demonstrate improvement in strength and function. Baseline: 11/11/23: TBD  Goal status: INITIAL  5.  Pt will increase AROM of L Ankle PF by at least 10 deg pain free in order to demonstrate improvement in pain and ankle mobility for normalized gait pattern. Baseline: 11/11/23: 10 deg Goal status: INITIAL  6. Pt will increase by at least 0.13 m/s with LRAD in order to demonstrate clinically significant improvement in community ambulation.  Baseline: 11/11/23: .62 m/s, CAM Boot  Goal status: INITIAL  PLAN: PT FREQUENCY: 1-2x/week  PT DURATION: 12 weeks  PLANNED INTERVENTIONS: Therapeutic exercises, Therapeutic activity, Neuromuscular re-education, Balance training, Gait training, Patient/Family education, Self Care, Joint mobilization, Joint manipulation, Vestibular training, Canalith repositioning,  Orthotic/Fit training, DME instructions, Dry Needling, Electrical stimulation, Spinal manipulation, Spinal mobilization, Cryotherapy, Moist heat, Taping, Traction, Ultrasound, Ionotophoresis 4mg /ml Dexamethasone , Manual therapy, and Re-evaluation.  PLAN FOR NEXT SESSION: Review HEP; Initiate ankle mobility, Weight Bearing Progression, Ankle Strengthening, Knee/hip Strengthening  Maryanne Finder, PT, DPT Physical Therapist - [place]  [facility] 12/03/2023, 11:16 AM

## 2023-12-03 ENCOUNTER — Ambulatory Visit

## 2023-12-03 ENCOUNTER — Telehealth: Payer: Self-pay | Admitting: Podiatry

## 2023-12-03 DIAGNOSIS — M79672 Pain in left foot: Secondary | ICD-10-CM | POA: Diagnosis not present

## 2023-12-03 DIAGNOSIS — S93692A Other sprain of left foot, initial encounter: Secondary | ICD-10-CM

## 2023-12-03 DIAGNOSIS — M6281 Muscle weakness (generalized): Secondary | ICD-10-CM | POA: Diagnosis not present

## 2023-12-03 DIAGNOSIS — M25675 Stiffness of left foot, not elsewhere classified: Secondary | ICD-10-CM | POA: Diagnosis not present

## 2023-12-03 DIAGNOSIS — R2689 Other abnormalities of gait and mobility: Secondary | ICD-10-CM | POA: Diagnosis not present

## 2023-12-03 NOTE — Telephone Encounter (Signed)
 pf lft mess and I called her bk. she will let me know after next visit if her job will snd form. they denied her accom. -she can't wear a boot at all. Did adv her of form fee is now 25 instead of 10

## 2023-12-04 NOTE — Therapy (Signed)
 OUTPATIENT PHYSICAL THERAPY ANKLE TREATMENT   Patient Name: Maria Sandoval MRN: [id number] DOB:12/13/1980, 43 y.o., female Today's Date: 12/05/2023  END OF SESSION:  PT End of Session - 12/05/23 0821     Visit Number 5    Number of Visits 17    Date for PT Re-Evaluation 01/08/24    Authorization Type [employer] auth#[id number] for 10 PT vsts from 8/27-10/19    Authorization - Number of Visits 10    PT Start Time 0820    PT Stop Time 0858    PT Time Calculation (min) 38 min    Activity Tolerance Patient tolerated treatment well    Behavior During Therapy [facility] for tasks assessed/performed             Past Medical History:  Diagnosis Date   Anxiety disorder    Past Surgical History:  Procedure Laterality Date   ANKLE SURGERY Right    Patient Active Problem List   Diagnosis Date Noted   Plantar fasciitis of left foot 03/18/2023   Pure hypercholesterolemia 03/18/2023   Eczema 03/18/2023   Anxiety and depression 03/18/2023   Class 1 obesity due to excess calories with body mass index (BMI) of 32.0 to 32.9 in adult 03/18/2023    PCP: Maria Angeline ORN, NP  REFERRING PROVIDER:  Silva Juliene SAUNDERS, DPM      REFERRING DIAG:  [phone] (ICD-10-CM) - Tenosynovitis of tibialis posterior tendon  M65.979 (ICD-10-CM) - Peroneal tenosynovitis  D06.307J (ICD-10-CM) - Rupture of plantar fascia of left foot, initial encounter    Rationale for Evaluation and Treatment: Rehabilitation  THERAPY DIAG: Pain in left foot  Other abnormalities of gait and mobility  Muscle weakness (generalized)  Stiffness of left foot, not elsewhere classified  Rupture of plantar fascia of left foot, initial encounter  ONSET DATE: 10/18/2023  FOLLOW-UP APPT SCHEDULED WITH REFERRING PROVIDER: Yes  SUBJECTIVE:                                                                                                                                                                                          SUBJECTIVE STATEMENT:    Patient reports to OPPT with a chief concern of L ankle pain following: LT EPF W PRP, LT TENOLYSIS POSTERIOR TIBIAL AND PERONEAL TENDONS W/[place] (10/18/2023)  PERTINENT HISTORY:   [name] is a 43 y.o. female was previously seen at this location for PT regarding plantar fascia rupture and is now presenting with L foot pain following L surgical procedure (See above) on 10/18/2023.  She reports that since the surgical date she has difficulty flexing her 4th and 5th digit. She still has difficulty with  showering, walking and accepting full weight bearing on her R foot. Currently ambulating CAM Boot; once in a while she will use a knee scooter for ambulation. Relieving factors: Ice modalities, elevation   She denies numbness and tingling, nausea, fever and parasthesia.   Imaging:   PAIN:    Pain Intensity: Present: 3-4/10, Best: 0/10, Worst: 5/10 Pain location: Surgical Site Pain Quality: intermittent, dull, and aching  Radiating: No  Swelling: Yes  Focal Weakness: No Typical footwear:   PRECAUTIONS: Fall  WEIGHT BEARING RESTRICTIONS:  Per Dr. Silva:  Restart physical therapy twice per week for 6 to 8 weeks no restrictions on weightbearing focus on strengthening conditioning manual tissue manipulation.  May be out of boot for weightbearing as tolerated   FALLS: Has patient fallen in last 6 months? 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  Recent memory is intact.   Remote memory is intact.  Attention span and concentration are intact.  Expressive speech is intact.  Patient's fund of knowledge is within normal limits for educational level.    Gross Musculoskeletal Assessment Bulk: Normal Tone: Normal No trophic changes noted to foot/ankle. No ecchymosis, erythema, or edema noted. No gross ankle/foot deformity noted  GAIT: Distance walked: 43m Assistive device utilized: Knee Scooter, [employer] Level of assistance: Modified independence Comments: No excessive pronation/supination noted during gait. No excessive shoe wear noted on medial or lateral surfaces  Posture:  AROM AROM (Normal range in degrees) AROM   Lumbar   Flexion (65)   Extension (30)   Right lateral flexion (25)   Left lateral flexion (25)   Right rotation (30)   Left rotation (30)       Hip Right Left  Flexion (125)    Extension (15)    Abduction (40)    Adduction     Internal Rotation (45)    External Rotation (45)        Knee    Flexion (135)    Extension (0)        Ankle    Dorsiflexion (20) 10 deg 7 deg  Plantarflexion (50) 20 deg 10 deg  Inversion (35)    Eversion (15)    (* = pain; Blank rows = not tested)   LE MMT: MMT (out of 5) Right  Left   Hip flexion 4- 4-  Hip extension    Hip abduction    Hip adduction    Hip internal rotation    Hip external rotation    Knee flexion 4- 4-  Knee extension 4 4  Ankle dorsiflexion 4+ Not Tested  Ankle plantarflexion 4+ Not Tested  Ankle inversion    Ankle eversion    (* = pain; Blank rows = not tested)  Sensation Grossly intact to light touch throughout bilateral LEs as determined by testing dermatomes L2-S2. Proprioception, stereognosis, and hot/cold testing deferred on this date.  Reflexes R/L Knee Jerk (L3/4): 2+/2+  Ankle Jerk (S1/2): 2+/2+   Muscle Length Hamstrings: R: Not examined L: Not  examined  Palpation Location LEFT  RIGHT           Calcaneus 0   Achilles Tendon 0   Flexor  Digitorum Tendon  2   Plantar Fascia 2   Peroneal Tendon 2   (Blank rows = not tested) Graded on 0-4 scale (0 = no pain, 1 = pain, 2 = pain with wincing/grimacing/flinching, 3 = pain with withdrawal, 4 = unwilling to allow palpation), (Blank rows = not tested)  Passive Accessory Motion Deferred  SPECIAL TESTS  Achilles Integrity Thompson Test: Negative   TODAY'S TREATMENT: DATE: 12/05/23    Subjective: Patient reports she stayed out of the boot most of the day after last therapy session but still feels like she needs to rest due to pain (throbbing).   Therapeutic Exercise:  Nustep level 3-1 x 5 minutes to encourage weightbearing through L foot and encourage ankle DF (average 15 spm)  Standing heel raises 2 x 15     Seated heel raises 2 x 10 with 3 second hold  Seated toe raises 2 x 10 with 3 second hold    Seated ankle PF/DF with YTB x 10 each   Neuromuscular Re-Education   Narrow BOS on airex pad 3 x 30 seconds     Semi tandem with one foot on airex and one foot on 6 stair 2 x 15 seconds-  painful    Stepping over (5) 6 hurdles x 3 laps leading with each LE    PATIENT EDUCATION:  Education details: Exercise Technique, POC   Person educated: Patient Education method: Explanation and Handouts Education comprehension: verbalized understanding and returned demonstration   HOME EXERCISE PROGRAM:  Access Code: [email or web address] URL: [email or web address] Date: 11/13/2023 Prepared by: Lonni Pall  Exercises - Seated Ankle Circles  - 2 x daily - 7 x weekly - 2-3 sets - 15-20 reps - Seated Ankle Pumps  - 2 x daily - 7 x weekly - 2-3 sets - 12-15 reps - 5s hold - Towel Scrunches  - 1 x daily - 7 x weekly - 2-3 sets - 10-12 reps - Seated Figure 4 Ankle Inversion with Resistance  - 1 x daily - 7 x weekly - 2-3 sets - 10-12 reps - 5s hold - Seated Heel Raise  - 1 x daily - 7 x weekly - 2-3 sets - 10-12 reps - 5s hold - Seated Ankle Eversion with Resistance  - 1  x daily - 7 x weekly - 2-3 sets - 10-12 reps - 5s hold - Seated Long Arc Quad  - 1 x daily - 7 x weekly - 2-3 sets - 12-15 reps - 5s hold - Supine Active Straight Leg Raise  - 1 x daily - 7 x weekly - 2-3 sets - 10-12 reps - 3s hold - Sidelying Hip Abduction  - 1 x daily - 7 x weekly - 2-3 sets - 10-12 reps - 3s hold  Access Code: [email or web address] URL: [email or web address] Date: 11/28/2023 Prepared by: Maryanne Finder  Exercises - Toe Spreading  - 2-3 x daily - 5-7 x weekly - 20 reps - Seated Toe Curl  - 2-3 x daily - 5-7 x weekly - 20 reps - Seated Toe Flexion Extension PROM  - 2-3 x daily - 5-7 x weekly - 10 reps - 5-10 second hold  ASSESSMENT:  CLINICAL IMPRESSION:    Session focused on functional activities focused on single leg stance and increased weightbearing through L foot. Tolerated session fair but with increased pain at  end of session. Patient will benefit from skilled PT interventions in order to facilitate return to PLOF, decrease risk of falls and maximize safe discharge to work related tasks.   OBJECTIVE IMPAIRMENTS: Abnormal gait, decreased activity tolerance, decreased balance, decreased endurance, difficulty walking, decreased ROM, decreased strength, hypomobility, and pain.   ACTIVITY LIMITATIONS: standing, squatting, stairs, transfers, and bathing  PARTICIPATION LIMITATIONS: occupation  PERSONAL FACTORS: Past/current experiences, Time since onset of injury/illness/exacerbation, and 1-2 comorbidities: anxiety, s/p ankle sx are also affecting patient's functional outcome.   REHAB POTENTIAL: Good  CLINICAL DECISION MAKING: Evolving/moderate complexity  EVALUATION COMPLEXITY: Moderate   GOALS: Goals reviewed with patient? Yes  SHORT TERM GOALS: Target date: 01/02/2024  Pt will be independent with HEP to improve strength and decrease ankle pain to improve pain-free function at home and work. Baseline: Initial HEP provided  Goal status: INITIAL   LONG  TERM GOALS: Target date: 01/30/2024  Pt will increase LEFS by at least 9 points in order to demonstrate significant improvement in lower extremity function. Baseline: 11/11/23: 37 / 80 = 46.3 % Goal status: INITIAL  2.  Pt will decrease worst ankle pain by at least 3 points on the NPRS in order to demonstrate clinically significant reduction in ankle pain. Baseline: 11/11/23: 5/10 NPS Goal status: INITIAL  3. Patient will be able to safely navigate a flight of 6 stairs, using proper foot placement, without handrail support, and without requiring assistance from PT in order to demonstrate significant improvement in LE strength and safety. Baseline: 11/11/23: Step to pattern - BUE Rail  Goal status: INITIAL  4.  Pt will increase strength of L Ankle DF/PF by at least 1/2 MMT grade in order to demonstrate improvement in strength and function. Baseline: 11/11/23: TBD  Goal status: INITIAL  5.  Pt will increase AROM of L Ankle PF by at least 10 deg pain free in order to demonstrate improvement in pain and ankle mobility for normalized gait pattern. Baseline: 11/11/23: 10 deg Goal status: INITIAL  6. Pt will increase by at least 0.13 m/s with LRAD in order to demonstrate clinically significant improvement in community ambulation.  Baseline: 11/11/23: .62 m/s, CAM Boot  Goal status: INITIAL  PLAN: PT FREQUENCY: 1-2x/week  PT DURATION: 12 weeks  PLANNED INTERVENTIONS: Therapeutic exercises, Therapeutic activity, Neuromuscular re-education, Balance training, Gait training, Patient/Family education, Self Care, Joint mobilization, Joint manipulation, Vestibular training, Canalith repositioning, Orthotic/Fit training, DME instructions, Dry Needling, Electrical stimulation, Spinal manipulation, Spinal mobilization, Cryotherapy, Moist heat, Taping, Traction, Ultrasound, Ionotophoresis 4mg /ml Dexamethasone , Manual therapy, and Re-evaluation.  PLAN FOR NEXT SESSION: Review HEP; Initiate ankle  mobility, Weight Bearing Progression, Ankle Strengthening, Knee/hip Strengthening  Maryanne Finder, PT, DPT Physical Therapist - [place]  [facility] 12/05/2023, 8:21 AM

## 2023-12-05 ENCOUNTER — Ambulatory Visit

## 2023-12-05 DIAGNOSIS — M6281 Muscle weakness (generalized): Secondary | ICD-10-CM

## 2023-12-05 DIAGNOSIS — S93692A Other sprain of left foot, initial encounter: Secondary | ICD-10-CM

## 2023-12-05 DIAGNOSIS — M25675 Stiffness of left foot, not elsewhere classified: Secondary | ICD-10-CM

## 2023-12-05 DIAGNOSIS — R2689 Other abnormalities of gait and mobility: Secondary | ICD-10-CM

## 2023-12-05 DIAGNOSIS — M79672 Pain in left foot: Secondary | ICD-10-CM | POA: Diagnosis not present

## 2023-12-06 ENCOUNTER — Other Ambulatory Visit: Payer: Self-pay | Admitting: Podiatry

## 2023-12-06 NOTE — Telephone Encounter (Signed)
 Patient submitted request for tramadol . PT is working on walking and increasing her activity. Pain rating is a 3/10 currently but after leaving PT she rates pain 6/10. She is utilizing elevation and an ice wrap that helps some but sometimes these are not enough. Please refill if appropriate and thank you.

## 2023-12-07 NOTE — Therapy (Signed)
 OUTPATIENT PHYSICAL THERAPY ANKLE TREATMENT   Patient Name: Maria Sandoval MRN: [id number] DOB:1980/04/20, 43 y.o., female Today's Date: 12/09/2023  END OF SESSION:  PT End of Session - 12/09/23 1116     Visit Number 6    Number of Visits 17    Date for Recertification  01/08/24    Authorization Type [employer] auth#[id number] for 10 PT vsts from 8/27-10/19    Authorization - Number of Visits 10    PT Start Time 1116    PT Stop Time 1158    PT Time Calculation (min) 42 min    Activity Tolerance Patient tolerated treatment well    Behavior During Therapy [facility] for tasks assessed/performed              Past Medical History:  Diagnosis Date   Anxiety disorder    Past Surgical History:  Procedure Laterality Date   ANKLE SURGERY Right    Patient Active Problem List   Diagnosis Date Noted   Plantar fasciitis of left foot 03/18/2023   Pure hypercholesterolemia 03/18/2023   Eczema 03/18/2023   Anxiety and depression 03/18/2023   Class 1 obesity due to excess calories with body mass index (BMI) of 32.0 to 32.9 in adult 03/18/2023    PCP: Antonette Angeline ORN, NP  REFERRING PROVIDER:  Silva Juliene SAUNDERS, DPM      REFERRING DIAG:  [phone] (ICD-10-CM) - Tenosynovitis of tibialis posterior tendon  M65.979 (ICD-10-CM) - Peroneal tenosynovitis  D06.307J (ICD-10-CM) - Rupture of plantar fascia of left foot, initial encounter    Rationale for Evaluation and Treatment: Rehabilitation  THERAPY DIAG: Pain in left foot  Other abnormalities of gait and mobility  Muscle weakness (generalized)  Stiffness of left foot, not elsewhere classified  Rupture of plantar fascia of left foot, initial encounter  ONSET DATE: 10/18/2023  FOLLOW-UP APPT SCHEDULED WITH REFERRING PROVIDER: Yes  SUBJECTIVE:                                                                                                                                                                                          SUBJECTIVE STATEMENT:    Patient reports to OPPT with a chief concern of L ankle pain following: LT EPF W PRP, LT TENOLYSIS POSTERIOR TIBIAL AND PERONEAL TENDONS W/TOPAZ (10/18/2023)  PERTINENT HISTORY:   Maria Sandoval is a 43 y.o. female was previously seen at this location for PT regarding plantar fascia rupture and is now presenting with L foot pain following L surgical procedure (See above) on 10/18/2023.  She reports that since the surgical date she has difficulty flexing her 4th and 5th digit. She still has difficulty  with showering, walking and accepting full weight bearing on her R foot. Currently ambulating CAM Boot; once in a while she will use a knee scooter for ambulation. Relieving factors: Ice modalities, elevation   She denies numbness and tingling, nausea, fever and parasthesia.   Imaging:   PAIN:    Pain Intensity: Present: 3-4/10, Best: 0/10, Worst: 5/10 Pain location: Surgical Site Pain Quality: intermittent, dull, and aching  Radiating: No  Swelling: Yes  Focal Weakness: No Typical footwear:   PRECAUTIONS: Fall  WEIGHT BEARING RESTRICTIONS:  Per Dr. Silva:  Restart physical therapy twice per week for 6 to 8 weeks no restrictions on weightbearing focus on strengthening conditioning manual tissue manipulation.  May be out of boot for weightbearing as tolerated   FALLS: Has patient fallen in last 6 months? 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  Recent memory is intact.   Remote memory is intact.  Attention span and concentration are intact.  Expressive speech is intact.  Patient's fund of knowledge is within normal limits for educational level.    Gross Musculoskeletal Assessment Bulk: Normal Tone: Normal No trophic changes noted to foot/ankle. No ecchymosis, erythema, or edema noted. No gross ankle/foot deformity noted  GAIT: Distance walked: 69m Assistive device utilized: Knee Scooter, [employer] Level of assistance: Modified independence Comments: No excessive pronation/supination noted during gait. No excessive shoe wear noted on medial or lateral surfaces  Posture:  AROM AROM (Normal range in degrees) AROM   Lumbar   Flexion (65)   Extension (30)   Right lateral flexion (25)   Left lateral flexion (25)   Right rotation (30)   Left rotation (30)       Hip Right Left  Flexion (125)    Extension (15)    Abduction (40)    Adduction     Internal Rotation (45)    External Rotation (45)        Knee    Flexion (135)    Extension (0)        Ankle    Dorsiflexion (20) 10 deg 7 deg  Plantarflexion (50) 20 deg 10 deg  Inversion (35)    Eversion (15)    (* = pain; Blank rows = not tested)   LE MMT: MMT (out of 5) Right  Left   Hip flexion 4- 4-  Hip extension    Hip abduction    Hip adduction    Hip internal rotation    Hip external rotation    Knee flexion 4- 4-  Knee extension 4 4  Ankle dorsiflexion 4+ Not Tested  Ankle plantarflexion 4+ Not Tested  Ankle inversion    Ankle eversion    (* = pain; Blank rows = not tested)  Sensation Grossly intact to light touch throughout bilateral LEs as determined by testing dermatomes L2-S2. Proprioception, stereognosis, and hot/cold testing deferred on this date.  Reflexes R/L Knee Jerk (L3/4): 2+/2+  Ankle Jerk (S1/2): 2+/2+   Muscle Length Hamstrings: R: Not examined L:  Not examined  Palpation Location LEFT  RIGHT           Calcaneus 0   Achilles Tendon 0   Flexor  Digitorum Tendon  2   Plantar Fascia 2   Peroneal Tendon 2   (Blank rows = not tested) Graded on 0-4 scale (0 = no pain, 1 = pain, 2 = pain with wincing/grimacing/flinching, 3 = pain with withdrawal, 4 = unwilling to allow palpation), (Blank rows = not tested)  Passive Accessory Motion Deferred  SPECIAL TESTS  Achilles Integrity Thompson Test: Negative   TODAY'S TREATMENT: DATE: 12/09/23     Subjective: Patient arrives to treatment session in CAM boot due to increased pain in her L foot this date. Only tolerates 2-3 hours without CAM boot on.  Therapeutic Exercise:   Seated ankle DF/PF on dynadisc x 20   Seated ankle inv/ev on dynadisc x 20   Seated self plantar fascia massage with lacrosse ball x 3 minutes    Seated heel raises 2 x 10 with 3 second hold  Seated toe raises 2 x 10 with 3 second hold    Seated ankle PF/DF with YTB x 10 each on L   Seated ankle inv/ev with YTB x 10 each on L      Updated HEP with ankle strengthening exercises with YTB   PATIENT EDUCATION:  Education details: Exercise Technique, POC   Person educated: Patient Education method: Explanation and Handouts Education comprehension: verbalized understanding and returned demonstration   HOME EXERCISE PROGRAM:  Access Code: [email or web address] URL: [email or web address] Date: 11/13/2023 Prepared by: Lonni Pall  Exercises - Seated Ankle Circles  - 2 x daily - 7 x weekly - 2-3 sets - 15-20 reps - Seated Ankle Pumps  - 2 x daily - 7 x weekly - 2-3 sets - 12-15 reps - 5s hold - Towel Scrunches  - 1 x daily - 7 x weekly - 2-3 sets - 10-12 reps - Seated Figure 4 Ankle Inversion with Resistance  - 1 x daily - 7 x weekly - 2-3 sets - 10-12 reps - 5s hold - Seated Heel Raise  - 1 x daily - 7 x weekly - 2-3 sets - 10-12 reps - 5s hold - Seated Ankle Eversion with Resistance  - 1 x daily - 7 x weekly - 2-3 sets - 10-12 reps - 5s hold - Seated Long Arc Quad  - 1 x daily - 7 x weekly - 2-3 sets -  12-15 reps - 5s hold - Supine Active Straight Leg Raise  - 1 x daily - 7 x weekly - 2-3 sets - 10-12 reps - 3s hold - Sidelying Hip Abduction  - 1 x daily - 7 x weekly - 2-3 sets - 10-12 reps - 3s hold  Access Code: [email or web address] URL: [email or web address] Date: 11/28/2023 Prepared by: Maryanne Finder  Exercises - Toe Spreading  - 2-3 x daily - 5-7 x weekly - 20 reps - Seated Toe Curl  - 2-3 x daily - 5-7 x weekly - 20 reps - Seated Toe Flexion Extension PROM  - 2-3 x daily - 5-7 x weekly - 10 reps - 5-10 second hold  Access Code: [email or web address] URL: [email or web address] Date: 12/09/2023 Prepared by: Maryanne Finder  Exercises - Long Sitting Ankle Eversion with Resistance  - 1 x daily - 3-4 x weekly - 3 sets - 10 reps - Long Sitting Ankle Inversion with Resistance  - 1 x daily - 3-4 x weekly - 3 sets - 10  reps - Long Sitting Ankle Plantar Flexion with Resistance  - 1 x daily - 3-4 x weekly - 3 sets - 10 reps - Long Sitting Ankle Dorsiflexion with Anchored Resistance  - 1 x daily - 3-4 x weekly - 3 sets - 10 reps  ASSESSMENT:  CLINICAL IMPRESSION:     Patient arrives to treatment session in CAM boot and did not bring regular shoe 2/2 increased pain this AM. Session focused on L ankle mobility and initiation of ankle strengthening with YTB. Updated HEP with ankle 4 way with resistance. Patient will benefit from skilled PT interventions in order to facilitate return to PLOF, decrease risk of falls and maximize safe discharge to work related tasks.   OBJECTIVE IMPAIRMENTS: Abnormal gait, decreased activity tolerance, decreased balance, decreased endurance, difficulty walking, decreased ROM, decreased strength, hypomobility, and pain.   ACTIVITY LIMITATIONS: standing, squatting, stairs, transfers, and bathing  PARTICIPATION LIMITATIONS: occupation  PERSONAL FACTORS: Past/current experiences, Time since onset of injury/illness/exacerbation, and 1-2 comorbidities: anxiety,  s/p ankle sx are also affecting patient's functional outcome.   REHAB POTENTIAL: Good  CLINICAL DECISION MAKING: Evolving/moderate complexity  EVALUATION COMPLEXITY: Moderate   GOALS: Goals reviewed with patient? Yes  SHORT TERM GOALS: Target date: 01/06/2024  Pt will be independent with HEP to improve strength and decrease ankle pain to improve pain-free function at home and work. Baseline: Initial HEP provided  Goal status: INITIAL   LONG TERM GOALS: Target date: 02/03/2024  Pt will increase LEFS by at least 9 points in order to demonstrate significant improvement in lower extremity function. Baseline: 11/11/23: 37 / 80 = 46.3 % Goal status: INITIAL  2.  Pt will decrease worst ankle pain by at least 3 points on the NPRS in order to demonstrate clinically significant reduction in ankle pain. Baseline: 11/11/23: 5/10 NPS Goal status: INITIAL  3. Patient will be able to safely navigate a flight of 6 stairs, using proper foot placement, without handrail support, and without requiring assistance from PT in order to demonstrate significant improvement in LE strength and safety. Baseline: 11/11/23: Step to pattern - BUE Rail  Goal status: INITIAL  4.  Pt will increase strength of L Ankle DF/PF by at least 1/2 MMT grade in order to demonstrate improvement in strength and function. Baseline: 11/11/23: TBD  Goal status: INITIAL  5.  Pt will increase AROM of L Ankle PF by at least 10 deg pain free in order to demonstrate improvement in pain and ankle mobility for normalized gait pattern. Baseline: 11/11/23: 10 deg Goal status: INITIAL  6. Pt will increase by at least 0.13 m/s with LRAD in order to demonstrate clinically significant improvement in community ambulation.  Baseline: 11/11/23: .62 m/s, CAM Boot  Goal status: INITIAL  PLAN: PT FREQUENCY: 1-2x/week  PT DURATION: 12 weeks  PLANNED INTERVENTIONS: Therapeutic exercises, Therapeutic activity, Neuromuscular  re-education, Balance training, Gait training, Patient/Family education, Self Care, Joint mobilization, Joint manipulation, Vestibular training, Canalith repositioning, Orthotic/Fit training, DME instructions, Dry Needling, Electrical stimulation, Spinal manipulation, Spinal mobilization, Cryotherapy, Moist heat, Taping, Traction, Ultrasound, Ionotophoresis 4mg /ml Dexamethasone , Manual therapy, and Re-evaluation.  PLAN FOR NEXT SESSION: Review HEP; Initiate ankle mobility, Weight Bearing Progression, Ankle Strengthening, Knee/hip Strengthening  Maryanne Finder, PT, DPT Physical Therapist - [place]  [facility] 12/09/2023, 11:17 AM

## 2023-12-09 ENCOUNTER — Ambulatory Visit

## 2023-12-09 DIAGNOSIS — M6281 Muscle weakness (generalized): Secondary | ICD-10-CM | POA: Diagnosis not present

## 2023-12-09 DIAGNOSIS — S93692A Other sprain of left foot, initial encounter: Secondary | ICD-10-CM | POA: Diagnosis not present

## 2023-12-09 DIAGNOSIS — M79672 Pain in left foot: Secondary | ICD-10-CM | POA: Diagnosis not present

## 2023-12-09 DIAGNOSIS — R2689 Other abnormalities of gait and mobility: Secondary | ICD-10-CM

## 2023-12-09 DIAGNOSIS — M25675 Stiffness of left foot, not elsewhere classified: Secondary | ICD-10-CM

## 2023-12-10 NOTE — Therapy (Incomplete)
 OUTPATIENT PHYSICAL THERAPY ANKLE TREATMENT   Patient Name: Maria Sandoval MRN: [id number] DOB:06-23-1980, 43 y.o., female Today's Date: 12/10/2023  END OF SESSION:        Past Medical History:  Diagnosis Date   Anxiety disorder    Past Surgical History:  Procedure Laterality Date   ANKLE SURGERY Right    Patient Active Problem List   Diagnosis Date Noted   Plantar fasciitis of left foot 03/18/2023   Pure hypercholesterolemia 03/18/2023   Eczema 03/18/2023   Anxiety and depression 03/18/2023   Class 1 obesity due to excess calories with body mass index (BMI) of 32.0 to 32.9 in adult 03/18/2023    PCP: Antonette Angeline ORN, NP  REFERRING PROVIDER:  Silva Juliene SAUNDERS, DPM      REFERRING DIAG:  [phone] (ICD-10-CM) - Tenosynovitis of tibialis posterior tendon  M65.979 (ICD-10-CM) - Peroneal tenosynovitis  D06.307J (ICD-10-CM) - Rupture of plantar fascia of left foot, initial encounter    Rationale for Evaluation and Treatment: Rehabilitation  THERAPY DIAG: Pain in left foot  Other abnormalities of gait and mobility  Muscle weakness (generalized)  Stiffness of left foot, not elsewhere classified  Rupture of plantar fascia of left foot, initial encounter  ONSET DATE: 10/18/2023  FOLLOW-UP APPT SCHEDULED WITH REFERRING PROVIDER: Yes  SUBJECTIVE:                                                                                                                                                                                         SUBJECTIVE STATEMENT:    Patient reports to OPPT with a chief concern of L ankle pain following: LT EPF W PRP, LT TENOLYSIS POSTERIOR TIBIAL AND PERONEAL TENDONS W/TOPAZ (10/18/2023)  PERTINENT HISTORY:   Maria Sandoval is a 43 y.o. female was previously seen at this location for PT regarding plantar fascia rupture and is now presenting with L foot pain following L surgical procedure (See above) on 10/18/2023.  She reports that since  the surgical date she has difficulty flexing her 4th and 5th digit. She still has difficulty with showering, walking and accepting full weight bearing on her R foot. Currently ambulating CAM Boot; once in a while she will use a knee scooter for ambulation. Relieving factors: Ice modalities, elevation   She denies numbness and tingling, nausea, fever and parasthesia.   Imaging:   PAIN:    Pain Intensity: Present: 3-4/10, Best: 0/10, Worst: 5/10 Pain location: Surgical Site Pain Quality: intermittent, dull, and aching  Radiating: No  Swelling: Yes  Focal Weakness: No Typical footwear:   PRECAUTIONS: Fall  WEIGHT BEARING RESTRICTIONS:  Per Dr. Silva:  Restart physical therapy twice  per week for 6 to 8 weeks no restrictions on weightbearing focus on strengthening conditioning manual tissue manipulation.  May be out of boot for weightbearing as tolerated   FALLS: Has patient fallen in last 6 months? 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  Recent memory is intact.  Remote memory is intact.  Attention span and concentration are intact.  Expressive speech is intact.  Patient's fund of knowledge is within normal limits for educational level.    Gross Musculoskeletal Assessment Bulk: Normal Tone: Normal No trophic changes noted to foot/ankle. No ecchymosis, erythema, or edema noted. No gross ankle/foot deformity noted  GAIT: Distance walked: 26m Assistive device utilized: Knee Scooter, [employer] Level of assistance: Modified independence Comments: No excessive pronation/supination noted during gait. No excessive shoe wear noted on medial or lateral surfaces  Posture:  AROM AROM (Normal range in degrees) AROM   Lumbar   Flexion (65)   Extension (30)   Right lateral flexion (25)   Left lateral flexion (25)   Right rotation (30)   Left rotation (30)       Hip Right Left  Flexion (125)    Extension (15)    Abduction (40)    Adduction     Internal Rotation (45)    External Rotation (45)        Knee    Flexion (135)    Extension (0)        Ankle    Dorsiflexion (20) 10 deg 7 deg  Plantarflexion (50) 20 deg 10 deg  Inversion (35)    Eversion (15)    (* = pain; Blank rows = not tested)   LE MMT: MMT (out of 5) Right  Left   Hip flexion 4- 4-  Hip extension    Hip abduction    Hip adduction    Hip internal rotation    Hip external rotation    Knee flexion 4- 4-  Knee extension 4 4  Ankle dorsiflexion 4+ Not Tested  Ankle plantarflexion 4+ Not Tested  Ankle inversion    Ankle eversion    (* = pain; Blank rows = not tested)  Sensation Grossly intact to light touch throughout bilateral LEs as determined by testing dermatomes L2-S2. Proprioception, stereognosis, and hot/cold testing deferred on this date.  Reflexes R/L Knee Jerk (L3/4): 2+/2+  Ankle Jerk (S1/2): 2+/2+   Muscle Length Hamstrings: R: Not examined L: Not examined  Palpation Location LEFT  RIGHT           Calcaneus 0   Achilles Tendon 0   Flexor Digitorum Tendon  2   Plantar Fascia 2   Peroneal Tendon 2   (Blank rows = not tested) Graded on 0-4 scale (0 = no pain, 1 = pain, 2 = pain with wincing/grimacing/flinching, 3 = pain with withdrawal, 4 = unwilling to allow palpation), (Blank rows = not tested)  Passive Accessory Motion Deferred  SPECIAL TESTS  Achilles Integrity Thompson Test: Negative   TODAY'S TREATMENT: DATE:  12/10/23    ***  Subjective: Patient arrives to  treatment session in CAM boot due to increased pain in her L foot this date. Only tolerates 2-3 hours without CAM boot on.  Therapeutic Exercise:   Seated ankle DF/PF on dynadisc x 20   Seated ankle inv/ev on dynadisc x 20   Seated self plantar fascia massage with lacrosse ball x 3 minutes    Seated heel raises 2 x 10 with 3 second hold  Seated toe raises 2 x 10 with 3 second hold    Seated ankle PF/DF with YTB x 10 each on L   Seated ankle inv/ev with YTB x 10 each on L      Updated HEP with ankle strengthening exercises with YTB   PATIENT EDUCATION:  Education details: Exercise Technique, POC   Person educated: Patient Education method: Explanation and Handouts Education comprehension: verbalized understanding and returned demonstration   HOME EXERCISE PROGRAM:  Access Code: [email or web address] URL: [email or web address] Date: 11/13/2023 Prepared by: Lonni Pall  Exercises - Seated Ankle Circles  - 2 x daily - 7 x weekly - 2-3 sets - 15-20 reps - Seated Ankle Pumps  - 2 x daily - 7 x weekly - 2-3 sets - 12-15 reps - 5s hold - Towel Scrunches  - 1 x daily - 7 x weekly - 2-3 sets - 10-12 reps - Seated Figure 4 Ankle Inversion with Resistance  - 1 x daily - 7 x weekly - 2-3 sets - 10-12 reps - 5s hold - Seated Heel Raise  - 1 x daily - 7 x weekly - 2-3 sets - 10-12 reps - 5s hold - Seated Ankle Eversion with Resistance  - 1 x daily - 7 x weekly - 2-3 sets - 10-12 reps - 5s hold - Seated Long Arc Quad  - 1 x daily - 7 x weekly - 2-3 sets - 12-15 reps - 5s hold - Supine Active Straight Leg Raise  - 1 x daily - 7 x weekly - 2-3 sets - 10-12 reps - 3s hold - Sidelying Hip Abduction  - 1 x daily - 7 x weekly - 2-3 sets - 10-12 reps - 3s hold  Access Code: [email or web address] URL: [email or web address] Date: 11/28/2023 Prepared by: Maryanne Finder  Exercises - Toe Spreading  - 2-3 x daily - 5-7 x weekly - 20 reps - Seated Toe Curl  - 2-3 x daily - 5-7 x weekly - 20  reps - Seated Toe Flexion Extension PROM  - 2-3 x daily - 5-7 x weekly - 10 reps - 5-10 second hold  Access Code: [email or web address] URL: [email or web address] Date: 12/09/2023 Prepared by: Maryanne Finder  Exercises - Long Sitting Ankle Eversion with Resistance  - 1 x daily - 3-4 x weekly - 3 sets - 10 reps - Long Sitting Ankle Inversion with Resistance  - 1 x daily - 3-4 x weekly - 3 sets - 10 reps - Long Sitting Ankle Plantar Flexion with Resistance  - 1 x daily - 3-4 x weekly - 3 sets - 10 reps - Long Sitting Ankle Dorsiflexion with Anchored Resistance  - 1 x daily - 3-4 x weekly - 3 sets - 10 reps  ASSESSMENT:  CLINICAL IMPRESSION:  ***    Patient arrives to treatment session in CAM boot and did not bring regular shoe 2/2 increased pain this AM. Session focused on L ankle mobility and initiation of ankle strengthening with YTB. Updated HEP with ankle 4 way with resistance. Patient will  benefit from skilled PT interventions in order to facilitate return to PLOF, decrease risk of falls and maximize safe discharge to work related tasks.   OBJECTIVE IMPAIRMENTS: Abnormal gait, decreased activity tolerance, decreased balance, decreased endurance, difficulty walking, decreased ROM, decreased strength, hypomobility, and pain.   ACTIVITY LIMITATIONS: standing, squatting, stairs, transfers, and bathing  PARTICIPATION LIMITATIONS: occupation  PERSONAL FACTORS: Past/current experiences, Time since onset of injury/illness/exacerbation, and 1-2 comorbidities: anxiety, s/p ankle sx are also affecting patient's functional outcome.   REHAB POTENTIAL: Good  CLINICAL DECISION MAKING: Evolving/moderate complexity  EVALUATION COMPLEXITY: Moderate   GOALS: Goals reviewed with patient? Yes  SHORT TERM GOALS: Target date: 01/07/2024  Pt will be independent with HEP to improve strength and decrease ankle pain to improve pain-free function at home and work. Baseline: Initial HEP provided  Goal  status: INITIAL   LONG TERM GOALS: Target date: 02/04/2024  Pt will increase LEFS by at least 9 points in order to demonstrate significant improvement in lower extremity function. Baseline: 11/11/23: 37 / 80 = 46.3 % Goal status: INITIAL  2.  Pt will decrease worst ankle pain by at least 3 points on the NPRS in order to demonstrate clinically significant reduction in ankle pain. Baseline: 11/11/23: 5/10 NPS Goal status: INITIAL  3. Patient will be able to safely navigate a flight of 6 stairs, using proper foot placement, without handrail support, and without requiring assistance from PT in order to demonstrate significant improvement in LE strength and safety. Baseline: 11/11/23: Step to pattern - BUE Rail  Goal status: INITIAL  4.  Pt will increase strength of L Ankle DF/PF by at least 1/2 MMT grade in order to demonstrate improvement in strength and function. Baseline: 11/11/23: TBD  Goal status: INITIAL  5.  Pt will increase AROM of L Ankle PF by at least 10 deg pain free in order to demonstrate improvement in pain and ankle mobility for normalized gait pattern. Baseline: 11/11/23: 10 deg Goal status: INITIAL  6. Pt will increase by at least 0.13 m/s with LRAD in order to demonstrate clinically significant improvement in community ambulation.  Baseline: 11/11/23: .62 m/s, CAM Boot  Goal status: INITIAL  PLAN: PT FREQUENCY: 1-2x/week  PT DURATION: 12 weeks  PLANNED INTERVENTIONS: Therapeutic exercises, Therapeutic activity, Neuromuscular re-education, Balance training, Gait training, Patient/Family education, Self Care, Joint mobilization, Joint manipulation, Vestibular training, Canalith repositioning, Orthotic/Fit training, DME instructions, Dry Needling, Electrical stimulation, Spinal manipulation, Spinal mobilization, Cryotherapy, Moist heat, Taping, Traction, Ultrasound, Ionotophoresis 4mg /ml Dexamethasone , Manual therapy, and Re-evaluation.  PLAN FOR NEXT SESSION:  Review HEP; Initiate ankle mobility, Weight Bearing Progression, Ankle Strengthening, Knee/hip Strengthening  Maryanne Finder, PT, DPT Physical Therapist - [facility]  [facility] 12/10/2023, 12:48 PM

## 2023-12-11 ENCOUNTER — Encounter: Payer: Self-pay | Admitting: Internal Medicine

## 2023-12-11 ENCOUNTER — Ambulatory Visit

## 2023-12-11 DIAGNOSIS — R2689 Other abnormalities of gait and mobility: Secondary | ICD-10-CM

## 2023-12-11 DIAGNOSIS — M25675 Stiffness of left foot, not elsewhere classified: Secondary | ICD-10-CM

## 2023-12-11 DIAGNOSIS — M6281 Muscle weakness (generalized): Secondary | ICD-10-CM

## 2023-12-11 DIAGNOSIS — M79672 Pain in left foot: Secondary | ICD-10-CM

## 2023-12-11 DIAGNOSIS — S93692A Other sprain of left foot, initial encounter: Secondary | ICD-10-CM

## 2023-12-11 NOTE — Therapy (Signed)
 OUTPATIENT PHYSICAL THERAPY ANKLE TREATMENT   Patient Name: Maria Sandoval MRN: [id number] DOB:[birth year], 42 y.o., female Today's Date: 12/12/2023  END OF SESSION:  PT End of Session - 12/12/23 0901     Visit Number 7    Number of Visits 17    Date for Recertification  01/08/24    Authorization Type [employer] auth#[id number] for 10 PT vsts from 8/27-10/19    Authorization - Number of Visits 10    PT Start Time 0901    PT Stop Time 0942    PT Time Calculation (min) 41 min    Activity Tolerance Patient tolerated treatment well    Behavior During Therapy [facility] for tasks assessed/performed               Past Medical History:  Diagnosis Date   Anxiety disorder    Past Surgical History:  Procedure Laterality Date   ANKLE SURGERY Right    Patient Active Problem List   Diagnosis Date Noted   Plantar fasciitis of left foot 03/18/2023   Pure hypercholesterolemia 03/18/2023   Eczema 03/18/2023   Anxiety and depression 03/18/2023   Class 1 obesity due to excess calories with body mass index (BMI) of 32.0 to 32.9 in adult 03/18/2023    PCP: Antonette Angeline ORN, NP  REFERRING PROVIDER:  Silva Juliene SAUNDERS, DPM      REFERRING DIAG:  [phone] (ICD-10-CM) - Tenosynovitis of tibialis posterior tendon  M65.979 (ICD-10-CM) - Peroneal tenosynovitis  D06.307J (ICD-10-CM) - Rupture of plantar fascia of left foot, initial encounter    Rationale for Evaluation and Treatment: Rehabilitation  THERAPY DIAG: Pain in left foot  Stiffness of left foot, not elsewhere classified  Other abnormalities of gait and mobility  Rupture of plantar fascia of left foot, initial encounter  Muscle weakness (generalized)  ONSET DATE: 10/18/2023  FOLLOW-UP APPT SCHEDULED WITH REFERRING PROVIDER: Yes  SUBJECTIVE:                                                                                                                                                                                          SUBJECTIVE STATEMENT:    Patient reports to OPPT with a chief concern of L ankle pain following: LT EPF W PRP, LT TENOLYSIS POSTERIOR TIBIAL AND PERONEAL TENDONS W/TOPAZ (10/18/2023)  PERTINENT HISTORY:   Maria Sandoval is a 43 y.o. female was previously seen at this location for PT regarding plantar fascia rupture and is now presenting with L foot pain following L surgical procedure (See above) on 10/18/2023.  She reports that since the surgical date she has difficulty flexing her 4th and 5th digit. She still has  difficulty with showering, walking and accepting full weight bearing on her R foot. Currently ambulating CAM Boot; once in a while she will use a knee scooter for ambulation. Relieving factors: Ice modalities, elevation   She denies numbness and tingling, nausea, fever and parasthesia.   Imaging:   PAIN:    Pain Intensity: Present: 3-4/10, Best: 0/10, Worst: 5/10 Pain location: Surgical Site Pain Quality: intermittent, dull, and aching  Radiating: No  Swelling: Yes  Focal Weakness: No Typical footwear:   PRECAUTIONS: Fall  WEIGHT BEARING RESTRICTIONS:  Per Dr. Silva:  Restart physical therapy twice per week for 6 to 8 weeks no restrictions on weightbearing focus on strengthening conditioning manual tissue manipulation.  May be out of boot for weightbearing as tolerated   FALLS: Has patient fallen in last 6 months? 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  Recent memory is intact.   Remote memory is intact.  Attention span and concentration are intact.  Expressive speech is intact.  Patient's fund of knowledge is within normal limits for educational level.    Gross Musculoskeletal Assessment Bulk: Normal Tone: Normal No trophic changes noted to foot/ankle. No ecchymosis, erythema, or edema noted. No gross ankle/foot deformity noted  GAIT: Distance walked: 56m Assistive device utilized: Knee Scooter, [employer] Level of assistance: Modified independence Comments: No excessive pronation/supination noted during gait. No excessive shoe wear noted on medial or lateral surfaces  Posture:  AROM AROM (Normal range in degrees) AROM   Lumbar   Flexion (65)   Extension (30)   Right lateral flexion (25)   Left lateral flexion (25)   Right rotation (30)   Left rotation (30)       Hip Right Left  Flexion (125)    Extension (15)    Abduction (40)    Adduction     Internal Rotation (45)    External Rotation (45)        Knee    Flexion (135)    Extension (0)        Ankle    Dorsiflexion (20) 10 deg 7 deg  Plantarflexion (50) 20 deg 10 deg  Inversion (35)    Eversion (15)    (* = pain; Blank rows = not tested)   LE MMT: MMT (out of 5) Right  Left   Hip flexion 4- 4-  Hip extension    Hip abduction    Hip adduction    Hip internal rotation    Hip external rotation    Knee flexion 4- 4-  Knee extension 4 4  Ankle dorsiflexion 4+ Not Tested  Ankle plantarflexion 4+ Not Tested  Ankle inversion    Ankle eversion    (* = pain; Blank rows = not tested)  Sensation Grossly intact to light touch throughout bilateral LEs as determined by testing dermatomes L2-S2. Proprioception, stereognosis, and hot/cold testing deferred on this date.  Reflexes R/L Knee Jerk (L3/4): 2+/2+  Ankle Jerk (S1/2): 2+/2+   Muscle Length Hamstrings: R: Not examined  L: Not examined  Palpation Location LEFT  RIGHT           Calcaneus 0   Achilles Tendon 0   Flexor  Digitorum Tendon  2   Plantar Fascia 2   Peroneal Tendon 2   (Blank rows = not tested) Graded on 0-4 scale (0 = no pain, 1 = pain, 2 = pain with wincing/grimacing/flinching, 3 = pain with withdrawal, 4 = unwilling to allow palpation), (Blank rows = not tested)  Passive Accessory Motion Deferred  SPECIAL TESTS  Achilles Integrity Thompson Test: Negative   TODAY'S TREATMENT: DATE: 12/12/23      Subjective: Patient reports she has a spot on the inside of the L foot that bulges when she walks and feels it is not normal. She has contacted her MD about it but current out of the office.    Manual Therapy:   Ice cup massage to L plantar fascia and medial side of L ankle x 5 minutes  Manual STM to plantar fascia and medial aspect of L ankle x 10 minutes - tenderness noted to medial arch   Therapeutic Activity:  Standing heel raise on half bolster 3 x 10 - double limb    Kettle bell squats 10# KB 3 x 10   Standing ankle DF stretching/mobilization on 1st step x 10 with 3 second hold    Fwd/bwd step over 6 hurdle x 10 leading with each LE    PATIENT EDUCATION:  Education details: Exercise Technique, POC   Person educated: Patient Education method: Explanation and Handouts Education comprehension: verbalized understanding and returned demonstration   HOME EXERCISE PROGRAM:  Access Code: [email or web address] URL: [email or web address] Date: 11/13/2023 Prepared by: Lonni Pall  Exercises - Seated Ankle Circles  - 2 x daily - 7 x weekly - 2-3 sets - 15-20 reps - Seated Ankle Pumps  - 2 x daily - 7 x weekly - 2-3 sets - 12-15 reps - 5s hold - Towel Scrunches  - 1 x daily - 7 x weekly - 2-3 sets - 10-12 reps - Seated Figure 4 Ankle Inversion with Resistance  - 1 x daily - 7 x weekly - 2-3 sets - 10-12 reps - 5s hold - Seated Heel Raise  - 1 x daily - 7 x weekly - 2-3 sets - 10-12 reps - 5s hold - Seated Ankle Eversion with Resistance  - 1 x daily - 7 x weekly - 2-3 sets -  10-12 reps - 5s hold - Seated Long Arc Quad  - 1 x daily - 7 x weekly - 2-3 sets - 12-15 reps - 5s hold - Supine Active Straight Leg Raise  - 1 x daily - 7 x weekly - 2-3 sets - 10-12 reps - 3s hold - Sidelying Hip Abduction  - 1 x daily - 7 x weekly - 2-3 sets - 10-12 reps - 3s hold  Access Code: [email or web address] URL: [email or web address] Date: 11/28/2023 Prepared by: Maryanne Finder  Exercises - Toe Spreading  - 2-3 x daily - 5-7 x weekly - 20 reps - Seated Toe Curl  - 2-3 x daily - 5-7 x weekly - 20 reps - Seated Toe Flexion Extension PROM  - 2-3 x daily - 5-7 x weekly - 10 reps - 5-10 second hold  Access Code: [email or web address] URL: [email or web address] Date: 12/09/2023 Prepared by: Maryanne Finder  Exercises - Long Sitting Ankle Eversion with Resistance  - 1 x daily - 3-4 x weekly - 3 sets - 10 reps - Long  Sitting Ankle Inversion with Resistance  - 1 x daily - 3-4 x weekly - 3 sets - 10 reps - Long Sitting Ankle Plantar Flexion with Resistance  - 1 x daily - 3-4 x weekly - 3 sets - 10 reps - Long Sitting Ankle Dorsiflexion with Anchored Resistance  - 1 x daily - 3-4 x weekly - 3 sets - 10 reps  ASSESSMENT:  CLINICAL IMPRESSION:      Continues to complain of increased pain in L foot with weightbearing. Session initiated with ice cup massage and STM for pain relief. Improved tolerance to functional activities after ice cup massage. Patient will benefit from skilled PT interventions in order to facilitate return to PLOF, decrease risk of falls and maximize safe discharge to work related tasks.   OBJECTIVE IMPAIRMENTS: Abnormal gait, decreased activity tolerance, decreased balance, decreased endurance, difficulty walking, decreased ROM, decreased strength, hypomobility, and pain.   ACTIVITY LIMITATIONS: standing, squatting, stairs, transfers, and bathing  PARTICIPATION LIMITATIONS: occupation  PERSONAL FACTORS: Past/current experiences, Time since onset of  injury/illness/exacerbation, and 1-2 comorbidities: anxiety, s/p ankle sx are also affecting patient's functional outcome.   REHAB POTENTIAL: Good  CLINICAL DECISION MAKING: Evolving/moderate complexity  EVALUATION COMPLEXITY: Moderate   GOALS: Goals reviewed with patient? Yes  SHORT TERM GOALS: Target date: 01/09/2024  Pt will be independent with HEP to improve strength and decrease ankle pain to improve pain-free function at home and work. Baseline: Initial HEP provided  Goal status: INITIAL   LONG TERM GOALS: Target date: 02/06/2024  Pt will increase LEFS by at least 9 points in order to demonstrate significant improvement in lower extremity function. Baseline: 11/11/23: 37 / 80 = 46.3 % Goal status: INITIAL  2.  Pt will decrease worst ankle pain by at least 3 points on the NPRS in order to demonstrate clinically significant reduction in ankle pain. Baseline: 11/11/23: 5/10 NPS Goal status: INITIAL  3. Patient will be able to safely navigate a flight of 6 stairs, using proper foot placement, without handrail support, and without requiring assistance from PT in order to demonstrate significant improvement in LE strength and safety. Baseline: 11/11/23: Step to pattern - BUE Rail  Goal status: INITIAL  4.  Pt will increase strength of L Ankle DF/PF by at least 1/2 MMT grade in order to demonstrate improvement in strength and function. Baseline: 11/11/23: TBD  Goal status: INITIAL  5.  Pt will increase AROM of L Ankle PF by at least 10 deg pain free in order to demonstrate improvement in pain and ankle mobility for normalized gait pattern. Baseline: 11/11/23: 10 deg Goal status: INITIAL  6. Pt will increase by at least 0.13 m/s with LRAD in order to demonstrate clinically significant improvement in community ambulation.  Baseline: 11/11/23: .62 m/s, CAM Boot  Goal status: INITIAL  PLAN: PT FREQUENCY: 1-2x/week  PT DURATION: 12 weeks  PLANNED INTERVENTIONS:  Therapeutic exercises, Therapeutic activity, Neuromuscular re-education, Balance training, Gait training, Patient/Family education, Self Care, Joint mobilization, Joint manipulation, Vestibular training, Canalith repositioning, Orthotic/Fit training, DME instructions, Dry Needling, Electrical stimulation, Spinal manipulation, Spinal mobilization, Cryotherapy, Moist heat, Taping, Traction, Ultrasound, Ionotophoresis 4mg /ml Dexamethasone , Manual therapy, and Re-evaluation.  PLAN FOR NEXT SESSION: Review HEP; Initiate ankle mobility, Weight Bearing Progression, Ankle Strengthening, Knee/hip Strengthening  Maryanne Finder, PT, DPT Physical Therapist -   [facility] [place] 12/12/2023, 9:02 AM

## 2023-12-12 ENCOUNTER — Ambulatory Visit

## 2023-12-12 DIAGNOSIS — S93692A Other sprain of left foot, initial encounter: Secondary | ICD-10-CM

## 2023-12-12 DIAGNOSIS — R2689 Other abnormalities of gait and mobility: Secondary | ICD-10-CM

## 2023-12-12 DIAGNOSIS — M79672 Pain in left foot: Secondary | ICD-10-CM

## 2023-12-12 DIAGNOSIS — M25675 Stiffness of left foot, not elsewhere classified: Secondary | ICD-10-CM

## 2023-12-12 DIAGNOSIS — M6281 Muscle weakness (generalized): Secondary | ICD-10-CM | POA: Diagnosis not present

## 2023-12-12 NOTE — Therapy (Incomplete)
 OUTPATIENT PHYSICAL THERAPY ANKLE TREATMENT   Patient Name: Maria Sandoval MRN: [id number] DOB:1980-06-04, 43 y.o., female Today's Date: 12/12/2023  END OF SESSION:         Past Medical History:  Diagnosis Date   Anxiety disorder    Past Surgical History:  Procedure Laterality Date   ANKLE SURGERY Right    Patient Active Problem List   Diagnosis Date Noted   Plantar fasciitis of left foot 03/18/2023   Pure hypercholesterolemia 03/18/2023   Eczema 03/18/2023   Anxiety and depression 03/18/2023   Class 1 obesity due to excess calories with body mass index (BMI) of 32.0 to 32.9 in adult 03/18/2023    PCP: Antonette Angeline ORN, NP  REFERRING PROVIDER:  Silva Juliene SAUNDERS, DPM      REFERRING DIAG:  [phone] (ICD-10-CM) - Tenosynovitis of tibialis posterior tendon  M65.979 (ICD-10-CM) - Peroneal tenosynovitis  D06.307J (ICD-10-CM) - Rupture of plantar fascia of left foot, initial encounter    Rationale for Evaluation and Treatment: Rehabilitation  THERAPY DIAG: Pain in left foot  Stiffness of left foot, not elsewhere classified  Other abnormalities of gait and mobility  Rupture of plantar fascia of left foot, initial encounter  Muscle weakness (generalized)  ONSET DATE: 10/18/2023  FOLLOW-UP APPT SCHEDULED WITH REFERRING PROVIDER: Yes  SUBJECTIVE:                                                                                                                                                                                         SUBJECTIVE STATEMENT:    Patient reports to OPPT with a chief concern of L ankle pain following: LT EPF W PRP, LT TENOLYSIS POSTERIOR TIBIAL AND PERONEAL TENDONS W/TOPAZ (10/18/2023)  PERTINENT HISTORY:   Maria Sandoval is a 43 y.o. female was previously seen at this location for PT regarding plantar fascia rupture and is now presenting with L foot pain following L surgical procedure (See above) on 10/18/2023.  She reports that since  the surgical date she has difficulty flexing her 4th and 5th digit. She still has difficulty with showering, walking and accepting full weight bearing on her R foot. Currently ambulating CAM Boot; once in a while she will use a knee scooter for ambulation. Relieving factors: Ice modalities, elevation   She denies numbness and tingling, nausea, fever and parasthesia.   Imaging:   PAIN:    Pain Intensity: Present: 3-4/10, Best: 0/10, Worst: 5/10 Pain location: Surgical Site Pain Quality: intermittent, dull, and aching  Radiating: No  Swelling: Yes  Focal Weakness: No Typical footwear:   PRECAUTIONS: Fall  WEIGHT BEARING RESTRICTIONS:  Per Dr. Silva:  Restart physical therapy  twice per week for 6 to 8 weeks no restrictions on weightbearing focus on strengthening conditioning manual tissue manipulation.  May be out of boot for weightbearing as tolerated   FALLS: Has patient fallen in last 6 months? 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  Recent memory is intact.  Remote memory is intact.  Attention span and concentration are intact.  Expressive speech is intact.  Patient's fund of knowledge is within normal limits for educational level.    Gross Musculoskeletal Assessment Bulk: Normal Tone: Normal No trophic changes noted to foot/ankle. No ecchymosis, erythema, or edema noted. No gross ankle/foot deformity noted  GAIT: Distance walked: 75m Assistive device utilized: Knee Scooter, [employer] Level of assistance: Modified independence Comments: No excessive pronation/supination noted during gait. No excessive shoe wear noted on medial or lateral surfaces  Posture:  AROM AROM (Normal range in degrees) AROM   Lumbar   Flexion (65)   Extension (30)   Right lateral flexion (25)   Left lateral flexion (25)   Right rotation (30)   Left rotation (30)       Hip Right Left  Flexion (125)    Extension (15)    Abduction (40)    Adduction     Internal Rotation (45)    External Rotation (45)        Knee    Flexion (135)    Extension (0)        Ankle    Dorsiflexion (20) 10 deg 7 deg  Plantarflexion (50) 20 deg 10 deg  Inversion (35)    Eversion (15)    (* = pain; Blank rows = not tested)   LE MMT: MMT (out of 5) Right  Left   Hip flexion 4- 4-  Hip extension    Hip abduction    Hip adduction    Hip internal rotation    Hip external rotation    Knee flexion 4- 4-  Knee extension 4 4  Ankle dorsiflexion 4+ Not Tested  Ankle plantarflexion 4+ Not Tested  Ankle inversion    Ankle eversion    (* = pain; Blank rows = not tested)  Sensation Grossly intact to light touch throughout bilateral LEs as determined by testing dermatomes L2-S2. Proprioception, stereognosis, and hot/cold testing deferred on this date.  Reflexes R/L Knee Jerk (L3/4): 2+/2+  Ankle Jerk (S1/2): 2+/2+   Muscle Length Hamstrings: R: Not examined L: Not examined  Palpation Location LEFT  RIGHT           Calcaneus 0   Achilles Tendon 0   Flexor Digitorum Tendon  2   Plantar Fascia 2   Peroneal Tendon 2   (Blank rows = not tested) Graded on 0-4 scale (0 = no pain, 1 = pain, 2 = pain with wincing/grimacing/flinching, 3 = pain with withdrawal, 4 = unwilling to allow palpation), (Blank rows = not tested)  Passive Accessory Motion Deferred  SPECIAL TESTS  Achilles Integrity Thompson Test: Negative   TODAY'S TREATMENT:  DATE: 12/12/23     ***  Subjective: Patient reports  she has a spot on the inside of the L foot that bulges when she walks and feels it is not normal. She has contacted her MD about it but current out of the office.    Manual Therapy:   Ice cup massage to L plantar fascia and medial side of L ankle x 5 minutes  Manual STM to plantar fascia and medial aspect of L ankle x 10 minutes - tenderness noted to medial arch   Therapeutic Activity:  Standing heel raise on half bolster 3 x 10 - double limb    Kettle bell squats 10# KB 3 x 10   Standing ankle DF stretching/mobilization on 1st step x 10 with 3 second hold    Fwd/bwd step over 6 hurdle x 10 leading with each LE    PATIENT EDUCATION:  Education details: Exercise Technique, POC   Person educated: Patient Education method: Explanation and Handouts Education comprehension: verbalized understanding and returned demonstration   HOME EXERCISE PROGRAM:  Access Code: [email or web address] URL: [email or web address] Date: 11/13/2023 Prepared by: Lonni Pall  Exercises - Seated Ankle Circles  - 2 x daily - 7 x weekly - 2-3 sets - 15-20 reps - Seated Ankle Pumps  - 2 x daily - 7 x weekly - 2-3 sets - 12-15 reps - 5s hold - Towel Scrunches  - 1 x daily - 7 x weekly - 2-3 sets - 10-12 reps - Seated Figure 4 Ankle Inversion with Resistance  - 1 x daily - 7 x weekly - 2-3 sets - 10-12 reps - 5s hold - Seated Heel Raise  - 1 x daily - 7 x weekly - 2-3 sets - 10-12 reps - 5s hold - Seated Ankle Eversion with Resistance  - 1 x daily - 7 x weekly - 2-3 sets - 10-12 reps - 5s hold - Seated Long Arc Quad  - 1 x daily - 7 x weekly - 2-3 sets - 12-15 reps - 5s hold - Supine Active Straight Leg Raise  - 1 x daily - 7 x weekly - 2-3 sets - 10-12 reps - 3s hold - Sidelying Hip Abduction  - 1 x daily - 7 x weekly - 2-3 sets - 10-12 reps - 3s hold  Access Code: [email or web address] URL: [email or web address] Date: 11/28/2023 Prepared by: Maryanne Finder  Exercises - Toe Spreading  - 2-3 x daily  - 5-7 x weekly - 20 reps - Seated Toe Curl  - 2-3 x daily - 5-7 x weekly - 20 reps - Seated Toe Flexion Extension PROM  - 2-3 x daily - 5-7 x weekly - 10 reps - 5-10 second hold  Access Code: [email or web address] URL: [email or web address] Date: 12/09/2023 Prepared by: Maryanne Finder  Exercises - Long Sitting Ankle Eversion with Resistance  - 1 x daily - 3-4 x weekly - 3 sets - 10 reps - Long Sitting Ankle Inversion with Resistance  - 1 x daily - 3-4 x weekly - 3 sets - 10 reps - Long Sitting Ankle Plantar Flexion with Resistance  - 1 x daily - 3-4 x weekly - 3 sets - 10 reps - Long Sitting Ankle Dorsiflexion with Anchored Resistance  - 1 x daily - 3-4 x weekly - 3 sets - 10 reps  ASSESSMENT:  CLINICAL IMPRESSION:     *** Continues to complain of increased pain in L foot with weightbearing. Session initiated with ice cup massage and STM for pain relief. Improved  tolerance to functional activities after ice cup massage. Patient will benefit from skilled PT interventions in order to facilitate return to PLOF, decrease risk of falls and maximize safe discharge to work related tasks.   OBJECTIVE IMPAIRMENTS: Abnormal gait, decreased activity tolerance, decreased balance, decreased endurance, difficulty walking, decreased ROM, decreased strength, hypomobility, and pain.   ACTIVITY LIMITATIONS: standing, squatting, stairs, transfers, and bathing  PARTICIPATION LIMITATIONS: occupation  PERSONAL FACTORS: Past/current experiences, Time since onset of injury/illness/exacerbation, and 1-2 comorbidities: anxiety, s/p ankle sx are also affecting patient's functional outcome.   REHAB POTENTIAL: Good  CLINICAL DECISION MAKING: Evolving/moderate complexity  EVALUATION COMPLEXITY: Moderate   GOALS: Goals reviewed with patient? Yes  SHORT TERM GOALS: Target date: 01/09/2024  Pt will be independent with HEP to improve strength and decrease ankle pain to improve pain-free function at home and  work. Baseline: Initial HEP provided  Goal status: INITIAL   LONG TERM GOALS: Target date: 02/06/2024  Pt will increase LEFS by at least 9 points in order to demonstrate significant improvement in lower extremity function. Baseline: 11/11/23: 37 / 80 = 46.3 % Goal status: INITIAL  2.  Pt will decrease worst ankle pain by at least 3 points on the NPRS in order to demonstrate clinically significant reduction in ankle pain. Baseline: 11/11/23: 5/10 NPS Goal status: INITIAL  3. Patient will be able to safely navigate a flight of 6 stairs, using proper foot placement, without handrail support, and without requiring assistance from PT in order to demonstrate significant improvement in LE strength and safety. Baseline: 11/11/23: Step to pattern - BUE Rail  Goal status: INITIAL  4.  Pt will increase strength of L Ankle DF/PF by at least 1/2 MMT grade in order to demonstrate improvement in strength and function. Baseline: 11/11/23: TBD  Goal status: INITIAL  5.  Pt will increase AROM of L Ankle PF by at least 10 deg pain free in order to demonstrate improvement in pain and ankle mobility for normalized gait pattern. Baseline: 11/11/23: 10 deg Goal status: INITIAL  6. Pt will increase by at least 0.13 m/s with LRAD in order to demonstrate clinically significant improvement in community ambulation.  Baseline: 11/11/23: .62 m/s, CAM Boot  Goal status: INITIAL  PLAN: PT FREQUENCY: 1-2x/week  PT DURATION: 12 weeks  PLANNED INTERVENTIONS: Therapeutic exercises, Therapeutic activity, Neuromuscular re-education, Balance training, Gait training, Patient/Family education, Self Care, Joint mobilization, Joint manipulation, Vestibular training, Canalith repositioning, Orthotic/Fit training, DME instructions, Dry Needling, Electrical stimulation, Spinal manipulation, Spinal mobilization, Cryotherapy, Moist heat, Taping, Traction, Ultrasound, Ionotophoresis 4mg /ml Dexamethasone , Manual therapy,  and Re-evaluation.  PLAN FOR NEXT SESSION: Review HEP; Initiate ankle mobility, Weight Bearing Progression, Ankle Strengthening, Knee/hip Strengthening  Maryanne Finder, PT, DPT Physical Therapist - [facility]  [facility] 12/12/2023, 2:14 PM

## 2023-12-13 ENCOUNTER — Other Ambulatory Visit: Payer: Self-pay

## 2023-12-16 ENCOUNTER — Ambulatory Visit

## 2023-12-16 DIAGNOSIS — M25675 Stiffness of left foot, not elsewhere classified: Secondary | ICD-10-CM

## 2023-12-16 DIAGNOSIS — S93692A Other sprain of left foot, initial encounter: Secondary | ICD-10-CM

## 2023-12-16 DIAGNOSIS — M6281 Muscle weakness (generalized): Secondary | ICD-10-CM

## 2023-12-16 DIAGNOSIS — R2689 Other abnormalities of gait and mobility: Secondary | ICD-10-CM

## 2023-12-16 DIAGNOSIS — M79672 Pain in left foot: Secondary | ICD-10-CM

## 2023-12-18 ENCOUNTER — Ambulatory Visit

## 2023-12-18 ENCOUNTER — Ambulatory Visit: Payer: Self-pay | Admitting: Podiatry

## 2023-12-18 VITALS — Ht 67.0 in | Wt 204.8 lb

## 2023-12-18 DIAGNOSIS — M65962 Unspecified synovitis and tenosynovitis, left lower leg: Secondary | ICD-10-CM

## 2023-12-18 DIAGNOSIS — M65969 Unspecified synovitis and tenosynovitis, unspecified lower leg: Secondary | ICD-10-CM

## 2023-12-18 MED ORDER — METHYLPREDNISOLONE 4 MG PO TBPK: ORAL_TABLET | ORAL | 0 refills | Status: DC

## 2023-12-19 NOTE — Therapy (Incomplete)
 OUTPATIENT PHYSICAL THERAPY ANKLE TREATMENT   Patient Name: Maria Sandoval MRN: [id number] DOB:1980/06/15, 43 y.o., female Today's Date: 12/19/2023  END OF SESSION:         Past Medical History:  Diagnosis Date   Anxiety disorder    Past Surgical History:  Procedure Laterality Date   ANKLE SURGERY Right    Patient Active Problem List   Diagnosis Date Noted   Plantar fasciitis of left foot 03/18/2023   Pure hypercholesterolemia 03/18/2023   Eczema 03/18/2023   Anxiety and depression 03/18/2023   Class 1 obesity due to excess calories with body mass index (BMI) of 32.0 to 32.9 in adult 03/18/2023    PCP: Antonette Angeline ORN, NP  REFERRING PROVIDER:  Silva Juliene SAUNDERS, DPM      REFERRING DIAG:  [phone] (ICD-10-CM) - Tenosynovitis of tibialis posterior tendon  M65.979 (ICD-10-CM) - Peroneal tenosynovitis  D06.307J (ICD-10-CM) - Rupture of plantar fascia of left foot, initial encounter    Rationale for Evaluation and Treatment: Rehabilitation  THERAPY DIAG: No diagnosis found.  ONSET DATE: 10/18/2023  FOLLOW-UP APPT SCHEDULED WITH REFERRING PROVIDER: Yes  SUBJECTIVE:                                                                                                                                                                                         SUBJECTIVE STATEMENT:    Patient reports to OPPT with a chief concern of L ankle pain following: LT EPF W PRP, LT TENOLYSIS POSTERIOR TIBIAL AND PERONEAL TENDONS W/TOPAZ (10/18/2023)  PERTINENT HISTORY:   Maria Sandoval is a 43 y.o. female was previously seen at this location for PT regarding plantar fascia rupture and is now presenting with L foot pain following L surgical procedure (See above) on 10/18/2023.  She reports that since the surgical date she has difficulty flexing her 4th and 5th digit. She still has difficulty with showering, walking and accepting full weight bearing on her R foot. Currently ambulating  CAM Boot; once in a while she will use a knee scooter for ambulation. Relieving factors: Ice modalities, elevation   She denies numbness and tingling, nausea, fever and parasthesia.   Imaging:   PAIN:    Pain Intensity: Present: 3-4/10, Best: 0/10, Worst: 5/10 Pain location: Surgical Site Pain Quality: intermittent, dull, and aching  Radiating: No  Swelling: Yes  Focal Weakness: No Typical footwear:   PRECAUTIONS: Fall  WEIGHT BEARING RESTRICTIONS:  Per Dr. Silva:  Restart physical therapy twice per week for 6 to 8 weeks no restrictions on weightbearing focus on strengthening conditioning manual tissue manipulation.  May be out of boot for weightbearing as tolerated  FALLS: Has patient fallen in last 6 months? 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  Recent memory is intact.  Remote memory is intact.  Attention span and concentration are intact.  Expressive speech is intact.  Patient's fund of knowledge is within normal limits for educational level.    Gross Musculoskeletal Assessment Bulk: Normal Tone: Normal No trophic changes noted to foot/ankle. No ecchymosis, erythema, or edema noted. No gross ankle/foot deformity noted  GAIT: Distance walked: 66m Assistive device utilized: Knee Scooter, [employer] Level of assistance: Modified independence Comments: No excessive pronation/supination noted during gait. No excessive shoe wear noted on medial or lateral  surfaces  Posture:  AROM AROM (Normal range in degrees) AROM   Lumbar   Flexion (65)   Extension (30)   Right lateral flexion (25)   Left lateral flexion (25)   Right rotation (30)   Left rotation (30)       Hip Right Left  Flexion (125)    Extension (15)    Abduction (40)    Adduction     Internal Rotation (45)    External Rotation (45)        Knee    Flexion (135)    Extension (0)        Ankle    Dorsiflexion (20) 10 deg 7 deg  Plantarflexion (50) 20 deg 10 deg  Inversion (35)    Eversion (15)    (* = pain; Blank rows = not tested)   LE MMT: MMT (out of 5) Right  Left   Hip flexion 4- 4-  Hip extension    Hip abduction    Hip adduction    Hip internal rotation    Hip external rotation    Knee flexion 4- 4-  Knee extension 4 4  Ankle dorsiflexion 4+ Not Tested  Ankle plantarflexion 4+ Not Tested  Ankle inversion    Ankle eversion    (* = pain; Blank rows = not tested)  Sensation Grossly intact to light touch throughout bilateral LEs as determined by testing dermatomes L2-S2. Proprioception, stereognosis, and hot/cold testing deferred on this date.  Reflexes R/L Knee Jerk (L3/4): 2+/2+  Ankle Jerk (S1/2): 2+/2+   Muscle Length Hamstrings: R: Not examined L: Not examined  Palpation Location LEFT  RIGHT           Calcaneus 0   Achilles Tendon 0   Flexor Digitorum Tendon  2   Plantar Fascia 2   Peroneal Tendon 2   (Blank rows = not tested) Graded on 0-4 scale (0 = no pain, 1 = pain, 2 = pain with wincing/grimacing/flinching, 3 = pain with withdrawal, 4 = unwilling to allow palpation), (Blank rows = not tested)  Passive Accessory Motion Deferred  SPECIAL TESTS  Achilles Integrity Thompson Test: Negative   TODAY'S TREATMENT: DATE: 12/19/23     ***  Subjective: Patient reports she has a spot on the inside of the L foot that bulges when she walks and feels it is  not normal. She has contacted her MD about it but current out of the office.     Manual Therapy:   Ice cup massage to L plantar fascia and medial side of L ankle x 5 minutes  Manual STM to plantar fascia and medial aspect of L ankle x 10 minutes - tenderness noted to medial arch   Therapeutic Activity:  Standing heel raise on half bolster 3 x 10 - double limb    Kettle bell squats 10# KB 3 x 10   Standing ankle DF stretching/mobilization on 1st step x 10 with 3 second hold    Fwd/bwd step over 6 hurdle x 10 leading with each LE    PATIENT EDUCATION:  Education details: Exercise Technique, POC   Person educated: Patient Education method: Explanation and Handouts Education comprehension: verbalized understanding and returned demonstration   HOME EXERCISE PROGRAM:  Access Code: [email or web address] URL: [email or web address] Date: 11/13/2023 Prepared by: Lonni Pall  Exercises - Seated Ankle Circles  - 2 x daily - 7 x weekly - 2-3 sets - 15-20 reps - Seated Ankle Pumps  - 2 x daily - 7 x weekly - 2-3 sets - 12-15 reps - 5s hold - Towel Scrunches  - 1 x daily - 7 x weekly - 2-3 sets - 10-12 reps - Seated Figure 4 Ankle Inversion with Resistance  - 1 x daily - 7 x weekly - 2-3 sets - 10-12 reps - 5s hold - Seated Heel Raise  - 1 x daily - 7 x weekly - 2-3 sets - 10-12 reps - 5s hold - Seated Ankle Eversion with Resistance  - 1 x daily - 7 x weekly - 2-3 sets - 10-12 reps - 5s hold - Seated Long Arc Quad  - 1 x daily - 7 x weekly - 2-3 sets - 12-15 reps - 5s hold - Supine Active Straight Leg Raise  - 1 x daily - 7 x weekly - 2-3 sets - 10-12 reps - 3s hold - Sidelying Hip Abduction  - 1 x daily - 7 x weekly - 2-3 sets - 10-12 reps - 3s hold  Access Code: [email or web address] URL: [email or web address] Date: 11/28/2023 Prepared by: Maryanne Finder  Exercises - Toe Spreading  - 2-3 x daily - 5-7 x weekly - 20 reps - Seated Toe Curl  - 2-3 x daily - 5-7 x weekly - 20 reps - Seated Toe Flexion Extension PROM  - 2-3 x daily - 5-7 x weekly - 10 reps -  5-10 second hold  Access Code: [email or web address] URL: [email or web address] Date: 12/09/2023 Prepared by: Maryanne Finder  Exercises - Long Sitting Ankle Eversion with Resistance  - 1 x daily - 3-4 x weekly - 3 sets - 10 reps - Long Sitting Ankle Inversion with Resistance  - 1 x daily - 3-4 x weekly - 3 sets - 10 reps - Long Sitting Ankle Plantar Flexion with Resistance  - 1 x daily - 3-4 x weekly - 3 sets - 10 reps - Long Sitting Ankle Dorsiflexion with Anchored Resistance  - 1 x daily - 3-4 x weekly - 3 sets - 10 reps  ASSESSMENT:  CLINICAL IMPRESSION:     *** Continues to complain of increased pain in L foot with weightbearing. Session initiated with ice cup massage and STM for pain relief. Improved tolerance to functional activities after ice cup massage. Patient will benefit from skilled PT interventions in order to facilitate return to PLOF, decrease risk of falls and maximize safe discharge to  work related tasks.   OBJECTIVE IMPAIRMENTS: Abnormal gait, decreased activity tolerance, decreased balance, decreased endurance, difficulty walking, decreased ROM, decreased strength, hypomobility, and pain.   ACTIVITY LIMITATIONS: standing, squatting, stairs, transfers, and bathing  PARTICIPATION LIMITATIONS: occupation  PERSONAL FACTORS: Past/current experiences, Time since onset of injury/illness/exacerbation, and 1-2 comorbidities: anxiety, s/p ankle sx are also affecting patient's functional outcome.   REHAB POTENTIAL: Good  CLINICAL DECISION MAKING: Evolving/moderate complexity  EVALUATION COMPLEXITY: Moderate   GOALS: Goals reviewed with patient? Yes  SHORT TERM GOALS: Target date: 01/16/2024  Pt will be independent with HEP to improve strength and decrease ankle pain to improve pain-free function at home and work. Baseline: Initial HEP provided  Goal status: INITIAL   LONG TERM GOALS: Target date: 02/13/2024  Pt will increase LEFS by at least 9 points in order to  demonstrate significant improvement in lower extremity function. Baseline: 11/11/23: 37 / 80 = 46.3 % Goal status: INITIAL  2.  Pt will decrease worst ankle pain by at least 3 points on the NPRS in order to demonstrate clinically significant reduction in ankle pain. Baseline: 11/11/23: 5/10 NPS Goal status: INITIAL  3. Patient will be able to safely navigate a flight of 6 stairs, using proper foot placement, without handrail support, and without requiring assistance from PT in order to demonstrate significant improvement in LE strength and safety. Baseline: 11/11/23: Step to pattern - BUE Rail  Goal status: INITIAL  4.  Pt will increase strength of L Ankle DF/PF by at least 1/2 MMT grade in order to demonstrate improvement in strength and function. Baseline: 11/11/23: TBD  Goal status: INITIAL  5.  Pt will increase AROM of L Ankle PF by at least 10 deg pain free in order to demonstrate improvement in pain and ankle mobility for normalized gait pattern. Baseline: 11/11/23: 10 deg Goal status: INITIAL  6. Pt will increase by at least 0.13 m/s with LRAD in order to demonstrate clinically significant improvement in community ambulation.  Baseline: 11/11/23: .62 m/s, CAM Boot  Goal status: INITIAL  PLAN: PT FREQUENCY: 1-2x/week  PT DURATION: 12 weeks  PLANNED INTERVENTIONS: Therapeutic exercises, Therapeutic activity, Neuromuscular re-education, Balance training, Gait training, Patient/Family education, Self Care, Joint mobilization, Joint manipulation, Vestibular training, Canalith repositioning, Orthotic/Fit training, DME instructions, Dry Needling, Electrical stimulation, Spinal manipulation, Spinal mobilization, Cryotherapy, Moist heat, Taping, Traction, Ultrasound, Ionotophoresis 4mg /ml Dexamethasone , Manual therapy, and Re-evaluation.  PLAN FOR NEXT SESSION: Review HEP; Initiate ankle mobility, Weight Bearing Progression, Ankle Strengthening, Knee/hip Strengthening  Maryanne Finder, PT, DPT Physical Therapist - [facility]  [facility] 12/19/2023, 12:34 PM

## 2023-12-20 ENCOUNTER — Encounter: Payer: Self-pay | Admitting: Internal Medicine

## 2023-12-20 ENCOUNTER — Ambulatory Visit: Admitting: Internal Medicine

## 2023-12-20 VITALS — BP 158/86 | Ht 67.0 in | Wt 195.8 lb

## 2023-12-20 DIAGNOSIS — M65969 Unspecified synovitis and tenosynovitis, unspecified lower leg: Secondary | ICD-10-CM

## 2023-12-20 DIAGNOSIS — G8929 Other chronic pain: Secondary | ICD-10-CM

## 2023-12-20 DIAGNOSIS — M722 Plantar fascial fibromatosis: Secondary | ICD-10-CM | POA: Diagnosis not present

## 2023-12-20 DIAGNOSIS — M25571 Pain in right ankle and joints of right foot: Secondary | ICD-10-CM

## 2023-12-20 DIAGNOSIS — M25572 Pain in left ankle and joints of left foot: Secondary | ICD-10-CM

## 2023-12-20 DIAGNOSIS — Z981 Arthrodesis status: Secondary | ICD-10-CM | POA: Diagnosis not present

## 2023-12-20 MED ORDER — GABAPENTIN 300 MG PO CAPS: 300.0000 mg | ORAL_CAPSULE | Freq: Three times a day (TID) | ORAL | 0 refills | Status: DC
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
## 2023-12-20 NOTE — Patient Instructions (Signed)
 Tendon Swelling (Tendinitis): What to Know  Tendinitis is irritation and swelling of a tendon. Tendons are strong tissues that connect muscle to bone. Tendinitis is most common in your: Shoulder. Ankle. Elbow. Wrist. What are the causes? Using a tendon or muscle too much. This is the most common cause. Wear and tear that happens as you age. Injury. Swelling from arthritis. Some medicines. What increases the risk? You may get tendinitis if you do the same movements again and again. What are the signs or symptoms? Pain. Tenderness. Mild swelling. Trouble moving the affected area. How is this treated? Treatment may include: RICE therapy. RICE stands for: Rest. Ice. Compression. This means putting pressure on the area. Elevation. This means raising the tendon above the level of your heart. Medicines. These may help with pain and swelling. Exercises or physical therapy. These can help you move better and get stronger. A brace or splint. Shots of cortisone. Surgery. This is rarely needed. Follow these instructions at home: If you have a splint or brace: Wear the splint or brace as told. Take it off only if your doctor says you can. Check the skin around it every day. Tell your doctor if you see problems. Loosen the splint or brace if your fingers or toes tingle, are numb, or turn cold and blue. Keep the splint or brace clean. If the splint or brace isn't waterproof: Do not let it get wet. Cover it when you take a bath or shower. Use a cover that doesn't let any water in. Managing pain, stiffness, and swelling     Use ice or an ice pack as told. If you have a splint or brace that you can take off, remove it only as told. Place a towel between your skin and the ice. Leave the ice on for 20 minutes, 2-3 times a day. Use heat as told. Use the heat source that your provider recommends, such as a moist heat pack or a heating pad. Do this as often as told. Place a towel between  your skin and the heat source. Leave the heat on for 20-30 minutes. If your skin turns red, take off the ice or heat right away to prevent skin damage. The risk of damage is higher if you can't feel pain, heat, or cold. Move your fingers or toes often to reduce stiffness and swelling. Raise the affected body part above the level of your heart while you're sitting or lying down. Use pillows as needed. Activity Rest the affected area as told. Ask when it's safe to drive if you have a splint or brace on your arm or leg. Try not to use the affected area while you have symptoms of tendinitis. Do exercises as told. Ask what things are safe for you to do at home. Ask when you can go back to work or school. General instructions Wear an elastic bandage or pressure (compression) wrap only as told. Take your medicines only as told. Keep all follow-up visits. Your doctor will check if the treatment is working. Contact a doctor if: You don't get better. You get new problems, such as numbness in your hands or feet. This information is not intended to replace advice given to you by your health care provider. Make sure you discuss any questions you have with your health care provider. Document Revised: 05/07/2023 Document Reviewed: 05/07/2023 Elsevier Patient Education  2025 [employer].

## 2023-12-22 ENCOUNTER — Encounter: Payer: Self-pay | Admitting: Podiatry

## 2023-12-22 NOTE — Progress Notes (Signed)
  Subjective:  Patient ID: Maria Sandoval, female    DOB: 1980/11/26,  MRN: [id number]  Chief Complaint  Patient presents with   Post-op Problem    RM 1 popping and pain (sch'ed per Powell). Pain on the dorsal aspect of the left foot.     43 y.o. female returns for post-op check.  Pain on the bottom has improved but still dealing with pain on the inside of the ankle.  Review of Systems: Negative except as noted in the HPI. Denies N/V/F/Ch.   Objective:  There were no vitals filed for this visit. Body mass index is 32.08 kg/m. Constitutional Well developed. Well nourished.  Vascular Foot warm and well perfused. Capillary refill normal to all digits.  Calf is soft and supple, no posterior calf or knee pain, negative Homans' sign  Neurologic Normal speech. Oriented to person, place, and time. Epicritic sensation to light touch grossly present bilaterally.  Dermatologic Skin is healed well without hypertrophy or tenderness  Orthopedic: Today she has no pain on the plantar fascia but on the medial side of the navicular at the insertion of the PT tendon.    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 Tenosynovitis of tibialis posterior tendon     Plan:  Patient was evaluated and treated and all questions answered.  S/p foot surgery left - Still continues to improve she has mostly resolved her plantar fasciitis type pain, the medial ankle pain is now consistent with PT tendinitis and accessory navicular pain.  Recommend methylprednisolone taper to reduce inflammation.  Shoe gear and activity as tolerated.  She will return in 3 weeks to reevaluate.  Due to this pain still unable to return to full duties at work, she will continue discussed with the best route forward is.  Return in about 3 weeks (around 01/08/2024) for f/u left foot pain .

## 2023-12-23 ENCOUNTER — Ambulatory Visit

## 2023-12-23 ENCOUNTER — Encounter: Payer: Self-pay | Admitting: Podiatry

## 2023-12-23 NOTE — Telephone Encounter (Signed)
 Pt lft mess that Melia Clore needs notes. I faxed notes 12/18/23 visit and note advising RTW approx 01/17/24.

## 2023-12-25 ENCOUNTER — Encounter: Admitting: Podiatry

## 2023-12-25 ENCOUNTER — Encounter: Payer: Self-pay | Admitting: Podiatry

## 2023-12-25 DIAGNOSIS — Z0389 Encounter for observation for other suspected diseases and conditions ruled out: Secondary | ICD-10-CM | POA: Diagnosis not present

## 2023-12-25 DIAGNOSIS — M25572 Pain in left ankle and joints of left foot: Secondary | ICD-10-CM | POA: Diagnosis not present

## 2023-12-25 DIAGNOSIS — S0990XA Unspecified injury of head, initial encounter: Secondary | ICD-10-CM | POA: Diagnosis not present

## 2023-12-25 DIAGNOSIS — M542 Cervicalgia: Secondary | ICD-10-CM | POA: Diagnosis not present

## 2023-12-25 DIAGNOSIS — R42 Dizziness and giddiness: Secondary | ICD-10-CM | POA: Diagnosis not present

## 2023-12-25 DIAGNOSIS — M79602 Pain in left arm: Secondary | ICD-10-CM | POA: Diagnosis not present

## 2023-12-25 DIAGNOSIS — M722 Plantar fascial fibromatosis: Secondary | ICD-10-CM | POA: Diagnosis not present

## 2023-12-25 DIAGNOSIS — M79672 Pain in left foot: Secondary | ICD-10-CM | POA: Diagnosis not present

## 2023-12-25 DIAGNOSIS — S300XXA Contusion of lower back and pelvis, initial encounter: Secondary | ICD-10-CM | POA: Diagnosis not present

## 2023-12-25 NOTE — Therapy (Signed)
 OUTPATIENT PHYSICAL THERAPY ANKLE TREATMENT   Patient Name: Maria Sandoval MRN: [id number] DOB:[birth year], 43 y.o., female Today's Date: 12/26/2023  END OF SESSION:  PT End of Session - 12/26/23 1306     Visit Number 8    Number of Visits 17    Date for Recertification  01/08/24    Authorization Type [employer] auth#[id number] for 10 PT vsts from 8/27-10/19    Authorization - Number of Visits 10    PT Start Time 1305    PT Stop Time 1345    PT Time Calculation (min) 40 min    Activity Tolerance Patient tolerated treatment well    Behavior During Therapy [facility] for tasks assessed/performed                Past Medical History:  Diagnosis Date   Anxiety disorder    Past Surgical History:  Procedure Laterality Date   ANKLE SURGERY Right    Patient Active Problem List   Diagnosis Date Noted   Pure hypercholesterolemia 03/18/2023   Eczema 03/18/2023   Anxiety and depression 03/18/2023   Class 1 obesity due to excess calories with body mass index (BMI) of 32.0 to 32.9 in adult 03/18/2023    PCP: Antonette Angeline ORN, NP  REFERRING PROVIDER:  Silva Juliene SAUNDERS, DPM      REFERRING DIAG:  [phone] (ICD-10-CM) - Tenosynovitis of tibialis posterior tendon  M65.979 (ICD-10-CM) - Peroneal tenosynovitis  D06.307J (ICD-10-CM) - Rupture of plantar fascia of left foot, initial encounter    Rationale for Evaluation and Treatment: Rehabilitation  THERAPY DIAG: Pain in left foot  Stiffness of left foot, not elsewhere classified  Other abnormalities of gait and mobility  Muscle weakness (generalized)  Rupture of plantar fascia of left foot, initial encounter  ONSET DATE: 10/18/2023  FOLLOW-UP APPT SCHEDULED WITH REFERRING PROVIDER: Yes  SUBJECTIVE:                                                                                                                                                                                         SUBJECTIVE STATEMENT:    Patient  reports to OPPT with a chief concern of L ankle pain following: LT EPF W PRP, LT TENOLYSIS POSTERIOR TIBIAL AND PERONEAL TENDONS W/TOPAZ (10/18/2023)  PERTINENT HISTORY:   Maria Sandoval is a 43 y.o. female was previously seen at this location for PT regarding plantar fascia rupture and is now presenting with L foot pain following L surgical procedure (See above) on 10/18/2023.  She reports that since the surgical date she has difficulty flexing her 4th and 5th digit. She still has difficulty with showering, walking and accepting full  weight bearing on her R foot. Currently ambulating CAM Boot; once in a while she will use a knee scooter for ambulation. Relieving factors: Ice modalities, elevation   She denies numbness and tingling, nausea, fever and parasthesia.   Imaging:   PAIN:    Pain Intensity: Present: 3-4/10, Best: 0/10, Worst: 5/10 Pain location: Surgical Site Pain Quality: intermittent, dull, and aching  Radiating: No  Swelling: Yes  Focal Weakness: No Typical footwear:   PRECAUTIONS: Fall  WEIGHT BEARING RESTRICTIONS:  Per Dr. Silva:  Restart physical therapy twice per week for 6 to 8 weeks no restrictions on weightbearing focus on strengthening conditioning manual tissue manipulation.  May be out of boot for weightbearing as tolerated   FALLS: Has patient fallen in last 6 months? 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  Recent memory is intact.  Remote memory is intact.  Attention  span and concentration are intact.  Expressive speech is intact.  Patient's fund of knowledge is within normal limits for educational level.    Gross Musculoskeletal Assessment Bulk: Normal Tone: Normal No trophic changes noted to foot/ankle. No ecchymosis, erythema, or edema noted. No gross ankle/foot deformity noted  GAIT: Distance walked: 60m Assistive device utilized: Knee Scooter, [employer] Level of assistance: Modified independence Comments: No excessive pronation/supination noted during gait. No excessive shoe wear noted on medial or lateral surfaces  Posture:  AROM AROM (Normal range in degrees) AROM   Lumbar   Flexion (65)   Extension (30)   Right lateral flexion (25)   Left lateral flexion (25)   Right rotation (30)   Left rotation (30)       Hip Right Left  Flexion (125)    Extension (15)    Abduction (40)    Adduction     Internal Rotation (45)    External Rotation (45)        Knee    Flexion (135)    Extension (0)        Ankle    Dorsiflexion (20) 10 deg 7 deg  Plantarflexion (50) 20 deg 10 deg  Inversion (35)    Eversion (15)    (* = pain; Blank rows = not tested)   LE MMT: MMT (out of 5) Right  Left   Hip flexion 4- 4-  Hip extension    Hip abduction    Hip adduction    Hip internal rotation    Hip external rotation    Knee flexion 4- 4-  Knee extension 4 4  Ankle dorsiflexion 4+ Not Tested  Ankle plantarflexion 4+ Not Tested  Ankle inversion    Ankle eversion    (* = pain; Blank rows = not tested)  Sensation Grossly intact to light touch throughout bilateral LEs as determined by testing dermatomes L2-S2. Proprioception, stereognosis, and hot/cold testing deferred on this date.  Reflexes R/L Knee Jerk (L3/4): 2+/2+  Ankle Jerk (S1/2): 2+/2+   Muscle Length Hamstrings: R: Not examined L: Not examined  Palpation Location LEFT  RIGHT           Calcaneus 0   Achilles Tendon 0   Flexor Digitorum Tendon  2   Plantar Fascia 2    Peroneal Tendon 2   (Blank rows = not tested) Graded on 0-4 scale (0 = no pain, 1 = pain, 2 = pain with wincing/grimacing/flinching, 3 = pain with withdrawal, 4 = unwilling to allow palpation), (Blank rows = not tested)  Passive Accessory Motion Deferred  SPECIAL TESTS  Achilles Integrity Thompson Test: Negative   TODAY'S TREATMENT: DATE: 12/26/23       Subjective: Patient reports she received steroids for her L foot and was about to end them when she just fell on 12/25/2023 after trying to swing from a grapevine. The bottom of her foot is sore.   Therapeutic Activity:  Standing heel raise on flat surface 3 x 10 - double limb    Standing heel raise on half bolster 3 x 10 - double limb    Kettle bell squats 10# KB 3 x 10    Stepping onto hedgehogs (7) x 6 laps    Walking over unlevel surface using large mat with ankle weights underneath  Step over large blue hedgehog x 10 leading with each LE - difficulty on L due to pain.    PATIENT EDUCATION:  Education details: Exercise Technique, POC   Person educated: Patient Education method: Explanation and Handouts Education comprehension: verbalized understanding and returned demonstration   HOME EXERCISE PROGRAM:  Access Code: [email or web address] URL: [email or web address] Date: 11/13/2023 Prepared by: Lonni Pall  Exercises - Seated Ankle Circles  - 2 x daily - 7 x weekly - 2-3 sets - 15-20 reps - Seated Ankle Pumps  - 2 x daily - 7 x weekly - 2-3 sets - 12-15 reps - 5s hold - Towel Scrunches  - 1 x daily - 7 x weekly - 2-3 sets - 10-12 reps - Seated Figure 4 Ankle Inversion with Resistance  - 1 x daily - 7 x weekly - 2-3 sets - 10-12 reps - 5s hold - Seated Heel Raise  - 1 x daily - 7 x weekly - 2-3 sets - 10-12 reps - 5s hold - Seated Ankle Eversion with Resistance  - 1 x daily - 7 x weekly - 2-3 sets - 10-12 reps - 5s hold - Seated Long Arc Quad  - 1 x daily - 7 x weekly - 2-3 sets - 12-15 reps - 5s hold -  Supine Active Straight Leg Raise  - 1 x daily - 7 x weekly - 2-3 sets - 10-12 reps - 3s hold - Sidelying Hip Abduction  - 1 x daily - 7 x weekly - 2-3 sets - 10-12 reps - 3s hold  Access Code: [email or web address] URL: [email or web address] Date: 11/28/2023 Prepared by: Maryanne Finder  Exercises - Toe Spreading  - 2-3 x daily - 5-7 x weekly - 20 reps - Seated Toe Curl  - 2-3 x daily - 5-7 x weekly - 20 reps - Seated Toe Flexion Extension PROM  - 2-3 x daily - 5-7 x weekly - 10 reps - 5-10 second hold  Access Code: [email or web address] URL: [email or web address] Date: 12/09/2023 Prepared by: Maryanne Finder  Exercises - Long Sitting Ankle Eversion with Resistance  - 1 x daily - 3-4 x weekly - 3 sets - 10 reps - Long Sitting Ankle Inversion with Resistance  - 1 x daily - 3-4 x weekly - 3 sets - 10 reps - Long Sitting Ankle  Plantar Flexion with Resistance  - 1 x daily - 3-4 x weekly - 3 sets - 10 reps - Long Sitting Ankle Dorsiflexion with Anchored Resistance  - 1 x daily - 3-4 x weekly - 3 sets - 10 reps  ASSESSMENT:  CLINICAL IMPRESSION:      Continues to complain of increased pain in L foot with weightbearing. Session focused on ankle strengthening and negotiation over unlevel/unstable surfaces. Difficulty bearing full weight on L foot due to pain. Patient will benefit from skilled PT interventions in order to facilitate return to PLOF, decrease risk of falls and maximize safe discharge to work related tasks.   OBJECTIVE IMPAIRMENTS: Abnormal gait, decreased activity tolerance, decreased balance, decreased endurance, difficulty walking, decreased ROM, decreased strength, hypomobility, and pain.   ACTIVITY LIMITATIONS: standing, squatting, stairs, transfers, and bathing  PARTICIPATION LIMITATIONS: occupation  PERSONAL FACTORS: Past/current experiences, Time since onset of injury/illness/exacerbation, and 1-2 comorbidities: anxiety, s/p ankle sx are also affecting patient's  functional outcome.   REHAB POTENTIAL: Good  CLINICAL DECISION MAKING: Evolving/moderate complexity  EVALUATION COMPLEXITY: Moderate   GOALS: Goals reviewed with patient? Yes  SHORT TERM GOALS: Target date: 01/23/2024  Pt will be independent with HEP to improve strength and decrease ankle pain to improve pain-free function at home and work. Baseline: Initial HEP provided  Goal status: INITIAL   LONG TERM GOALS: Target date: 02/20/2024  Pt will increase LEFS by at least 9 points in order to demonstrate significant improvement in lower extremity function. Baseline: 11/11/23: 37 / 80 = 46.3 % Goal status: INITIAL  2.  Pt will decrease worst ankle pain by at least 3 points on the NPRS in order to demonstrate clinically significant reduction in ankle pain. Baseline: 11/11/23: 5/10 NPS Goal status: INITIAL  3. Patient will be able to safely navigate a flight of 6 stairs, using proper foot placement, without handrail support, and without requiring assistance from PT in order to demonstrate significant improvement in LE strength and safety. Baseline: 11/11/23: Step to pattern - BUE Rail  Goal status: INITIAL  4.  Pt will increase strength of L Ankle DF/PF by at least 1/2 MMT grade in order to demonstrate improvement in strength and function. Baseline: 11/11/23: TBD  Goal status: INITIAL  5.  Pt will increase AROM of L Ankle PF by at least 10 deg pain free in order to demonstrate improvement in pain and ankle mobility for normalized gait pattern. Baseline: 11/11/23: 10 deg Goal status: INITIAL  6. Pt will increase by at least 0.13 m/s with LRAD in order to demonstrate clinically significant improvement in community ambulation.  Baseline: 11/11/23: .62 m/s, CAM Boot  Goal status: INITIAL  PLAN: PT FREQUENCY: 1-2x/week  PT DURATION: 12 weeks  PLANNED INTERVENTIONS: Therapeutic exercises, Therapeutic activity, Neuromuscular re-education, Balance training, Gait training,  Patient/Family education, Self Care, Joint mobilization, Joint manipulation, Vestibular training, Canalith repositioning, Orthotic/Fit training, DME instructions, Dry Needling, Electrical stimulation, Spinal manipulation, Spinal mobilization, Cryotherapy, Moist heat, Taping, Traction, Ultrasound, Ionotophoresis 4mg /ml Dexamethasone , Manual therapy, and Re-evaluation.  PLAN FOR NEXT SESSION: Review HEP; Initiate ankle mobility, Weight Bearing Progression, Ankle Strengthening, Knee/hip Strengthening  Maryanne Finder, PT, DPT Physical Therapist - [facility]  [facility] 12/26/2023, 1:06 PM

## 2023-12-26 ENCOUNTER — Ambulatory Visit: Attending: Podiatry

## 2023-12-26 DIAGNOSIS — S93692A Other sprain of left foot, initial encounter: Secondary | ICD-10-CM | POA: Insufficient documentation

## 2023-12-26 DIAGNOSIS — M6281 Muscle weakness (generalized): Secondary | ICD-10-CM | POA: Diagnosis not present

## 2023-12-26 DIAGNOSIS — H5213 Myopia, bilateral: Secondary | ICD-10-CM | POA: Diagnosis not present

## 2023-12-26 DIAGNOSIS — R2689 Other abnormalities of gait and mobility: Secondary | ICD-10-CM | POA: Diagnosis not present

## 2023-12-26 DIAGNOSIS — M79672 Pain in left foot: Secondary | ICD-10-CM | POA: Diagnosis not present

## 2023-12-26 DIAGNOSIS — M25675 Stiffness of left foot, not elsewhere classified: Secondary | ICD-10-CM | POA: Diagnosis not present

## 2023-12-26 MED ORDER — TRAMADOL HCL 50 MG PO TABS: 50.0000 mg | ORAL_TABLET | Freq: Four times a day (QID) | ORAL | 0 refills | Status: DC | PRN

## 2023-12-29 DIAGNOSIS — Z419 Encounter for procedure for purposes other than remedying health state, unspecified: Secondary | ICD-10-CM | POA: Diagnosis not present

## 2023-12-31 ENCOUNTER — Ambulatory Visit

## 2023-12-31 DIAGNOSIS — M6281 Muscle weakness (generalized): Secondary | ICD-10-CM | POA: Diagnosis not present

## 2023-12-31 DIAGNOSIS — S93692A Other sprain of left foot, initial encounter: Secondary | ICD-10-CM | POA: Diagnosis not present

## 2023-12-31 DIAGNOSIS — R2689 Other abnormalities of gait and mobility: Secondary | ICD-10-CM | POA: Diagnosis not present

## 2023-12-31 DIAGNOSIS — M25675 Stiffness of left foot, not elsewhere classified: Secondary | ICD-10-CM | POA: Diagnosis not present

## 2023-12-31 DIAGNOSIS — M79672 Pain in left foot: Secondary | ICD-10-CM | POA: Diagnosis not present

## 2023-12-31 NOTE — Therapy (Signed)
 OUTPATIENT PHYSICAL THERAPY ANKLE TREATMENT   Patient Name: Maria Sandoval MRN: [id number] DOB:Feb 13, 1981, 43 y.o., female Today's Date: 12/31/2023  END OF SESSION:  PT End of Session - 12/31/23 1345     Visit Number 9    Number of Visits 17    Date for Recertification  01/08/24    Authorization Type [employer] auth#[id number] for 10 PT vsts from 8/27-10/19    Authorization - Number of Visits 10    PT Start Time 1345    PT Stop Time 1426    PT Time Calculation (min) 41 min    Activity Tolerance Patient tolerated treatment well    Behavior During Therapy [facility] for tasks assessed/performed          Past Medical History:  Diagnosis Date   Anxiety disorder    Past Surgical History:  Procedure Laterality Date   ANKLE SURGERY Right    Patient Active Problem List   Diagnosis Date Noted   Pure hypercholesterolemia 03/18/2023   Eczema 03/18/2023   Anxiety and depression 03/18/2023   Class 1 obesity due to excess calories with body mass index (BMI) of 32.0 to 32.9 in adult 03/18/2023    PCP: Antonette Angeline ORN, NP  REFERRING PROVIDER:  Silva Juliene SAUNDERS, DPM      REFERRING DIAG:  [phone] (ICD-10-CM) - Tenosynovitis of tibialis posterior tendon  M65.979 (ICD-10-CM) - Peroneal tenosynovitis  D06.307J (ICD-10-CM) - Rupture of plantar fascia of left foot, initial encounter    Rationale for Evaluation and Treatment: Rehabilitation  THERAPY DIAG: Pain in left foot  Stiffness of left foot, not elsewhere classified  Other abnormalities of gait and mobility  Muscle weakness (generalized)  Rupture of plantar fascia of left foot, initial encounter  ONSET DATE: 10/18/2023  FOLLOW-UP APPT SCHEDULED WITH REFERRING PROVIDER: Yes  SUBJECTIVE:                                                                                                                                                                                         SUBJECTIVE STATEMENT:    Patient reports to OPPT  with a chief concern of L ankle pain following: LT EPF W PRP, LT TENOLYSIS POSTERIOR TIBIAL AND PERONEAL TENDONS W/TOPAZ (10/18/2023)  PERTINENT HISTORY:   Maria Sandoval is a 43 y.o. female was previously seen at this location for PT regarding plantar fascia rupture and is now presenting with L foot pain following L surgical procedure (See above) on 10/18/2023.  She reports that since the surgical date she has difficulty flexing her 4th and 5th digit. She still has difficulty with showering, walking and accepting full weight bearing on her R foot.  Currently ambulating CAM Boot; once in a while she will use a knee scooter for ambulation. Relieving factors: Ice modalities, elevation   She denies numbness and tingling, nausea, fever and parasthesia.   Imaging:   PAIN:    Pain Intensity: Present: 3-4/10, Best: 0/10, Worst: 5/10 Pain location: Surgical Site Pain Quality: intermittent, dull, and aching  Radiating: No  Swelling: Yes  Focal Weakness: No Typical footwear:   PRECAUTIONS: Fall  WEIGHT BEARING RESTRICTIONS:  Per Dr. Silva:  Restart physical therapy twice per week for 6 to 8 weeks no restrictions on weightbearing focus on strengthening conditioning manual tissue manipulation.  May be out of boot for weightbearing as tolerated   FALLS: Has patient fallen in last 6 months? 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  Recent memory is intact.  Remote memory is intact.  Attention span and  concentration are intact.  Expressive speech is intact.  Patient's fund of knowledge is within normal limits for educational level.    Gross Musculoskeletal Assessment Bulk: Normal Tone: Normal No trophic changes noted to foot/ankle. No ecchymosis, erythema, or edema noted. No gross ankle/foot deformity noted  GAIT: Distance walked: 50m Assistive device utilized: Knee Scooter, [employer] Level of assistance: Modified independence Comments: No excessive pronation/supination noted during gait. No excessive shoe wear noted on medial or lateral surfaces  Posture:  AROM AROM (Normal range in degrees) AROM   Lumbar   Flexion (65)   Extension (30)   Right lateral flexion (25)   Left lateral flexion (25)   Right rotation (30)   Left rotation (30)       Hip Right Left  Flexion (125)    Extension (15)    Abduction (40)    Adduction     Internal Rotation (45)    External Rotation (45)        Knee    Flexion (135)    Extension (0)        Ankle    Dorsiflexion (20) 10 deg 7 deg  Plantarflexion (50) 20 deg 10 deg  Inversion (35)    Eversion (15)    (* = pain; Blank rows = not tested)   LE MMT: MMT (out of 5) Right  Left   Hip flexion 4- 4-  Hip extension    Hip abduction    Hip adduction    Hip internal rotation    Hip external rotation    Knee flexion 4- 4-  Knee extension 4 4  Ankle dorsiflexion 4+ Not Tested  Ankle plantarflexion 4+ Not Tested  Ankle inversion    Ankle eversion    (* = pain; Blank rows = not tested)  Sensation Grossly intact to light touch throughout bilateral LEs as determined by testing dermatomes L2-S2. Proprioception, stereognosis, and hot/cold testing deferred on this date.  Reflexes R/L Knee Jerk (L3/4): 2+/2+  Ankle Jerk (S1/2): 2+/2+   Muscle Length Hamstrings: R: Not examined L: Not examined  Palpation Location LEFT  RIGHT  Calcaneus 0   Achilles Tendon 0   Flexor Digitorum Tendon  2   Plantar Fascia 2   Peroneal  Tendon 2   (Blank rows = not tested) Graded on 0-4 scale (0 = no pain, 1 = pain, 2 = pain with wincing/grimacing/flinching, 3 = pain with withdrawal, 4 = unwilling to allow palpation), (Blank rows = not tested)  Passive Accessory Motion Deferred  SPECIAL TESTS  Achilles Integrity Thompson Test: Negative   TODAY'S TREATMENT: DATE: 12/31/23     Subjective:  Patient feel stressed about recent termination from her job.  Therapeutic Activity:   Nustep level 3-1 x 5 minutes to encourage weightbearing through L foot and encourage ankle DF (average 15 spm)    Standing heel raise on 6 step 3 x 10 - double limb    Kettle bell squats 10# KB 3 x 10    Fwd lunge onto bosu ball 2 x 10 leading with each LE   Fwd step ups onto 6 step  x 10 each LE  PATIENT EDUCATION:  Education details: Exercise Technique, POC   Person educated: Patient Education method: Explanation and Handouts Education comprehension: verbalized understanding and returned demonstration   HOME EXERCISE PROGRAM:  Access Code: [email or web address] URL: [email or web address] Date: 11/13/2023 Prepared by: Lonni Pall  Exercises - Seated Ankle Circles  - 2 x daily - 7 x weekly - 2-3 sets - 15-20 reps - Seated Ankle Pumps  - 2 x daily - 7 x weekly - 2-3 sets - 12-15 reps - 5s hold - Towel Scrunches  - 1 x daily - 7 x weekly - 2-3 sets - 10-12 reps - Seated Figure 4 Ankle Inversion with Resistance  - 1 x daily - 7 x weekly - 2-3 sets - 10-12 reps - 5s hold - Seated Heel Raise  - 1 x daily - 7 x weekly - 2-3 sets - 10-12 reps - 5s hold - Seated Ankle Eversion with Resistance  - 1 x daily - 7 x weekly - 2-3 sets - 10-12 reps - 5s hold - Seated Long Arc Quad  - 1 x daily - 7 x weekly - 2-3 sets - 12-15 reps - 5s hold - Supine Active Straight Leg Raise  - 1 x daily - 7 x weekly - 2-3 sets - 10-12 reps - 3s hold - Sidelying Hip Abduction  - 1 x daily - 7 x weekly - 2-3 sets - 10-12 reps - 3s hold  Access Code:  [email or web address] URL: [email or web address] Date: 11/28/2023 Prepared by: Maryanne Finder  Exercises - Toe Spreading  - 2-3 x daily - 5-7 x weekly - 20 reps - Seated Toe Curl  - 2-3 x daily - 5-7 x weekly - 20 reps - Seated Toe Flexion Extension PROM  - 2-3 x daily - 5-7 x weekly - 10 reps - 5-10 second hold  Access Code: [email or web address] URL: [email or web address] Date: 12/09/2023 Prepared by: Maryanne Finder  Exercises - Long Sitting Ankle Eversion with Resistance  - 1 x daily - 3-4 x weekly - 3 sets - 10 reps - Long Sitting Ankle Inversion with Resistance  - 1 x daily - 3-4 x weekly - 3 sets - 10 reps - Long Sitting Ankle Plantar Flexion with Resistance  - 1 x daily - 3-4 x weekly - 3 sets - 10 reps - Long Sitting Ankle Dorsiflexion with Anchored Resistance  - 1 x daily - 3-4 x weekly - 3 sets - 10 reps  ASSESSMENT:  CLINICAL IMPRESSION:  Continues to complain of increased pain in L foot with weightbearing. Session focused on ankle strengthening and negotiation over unlevel/unstable surfaces. Difficulty bearing full weight on L foot due to pain in the arch. Patient will benefit from skilled PT interventions in order to facilitate return to PLOF, decrease risk of falls and maximize safe discharge to work related tasks.   OBJECTIVE IMPAIRMENTS: Abnormal gait, decreased activity tolerance, decreased balance, decreased endurance, difficulty walking, decreased ROM, decreased strength, hypomobility, and pain.   ACTIVITY LIMITATIONS: standing, squatting, stairs, transfers, and bathing  PARTICIPATION LIMITATIONS: occupation  PERSONAL FACTORS: Past/current experiences, Time since onset of injury/illness/exacerbation, and 1-2 comorbidities: anxiety, s/p ankle sx are also affecting patient's functional outcome.   REHAB POTENTIAL: Good  CLINICAL DECISION MAKING: Evolving/moderate complexity  EVALUATION COMPLEXITY: Moderate   GOALS: Goals reviewed with patient?  Yes  SHORT TERM GOALS: Target date: 01/28/2024  Pt will be independent with HEP to improve strength and decrease ankle pain to improve pain-free function at home and work. Baseline: Initial HEP provided  Goal status: INITIAL   LONG TERM GOALS: Target date: 02/25/2024  Pt will increase LEFS by at least 9 points in order to demonstrate significant improvement in lower extremity function. Baseline: 11/11/23: 37 / 80 = 46.3 % Goal status: INITIAL  2.  Pt will decrease worst ankle pain by at least 3 points on the NPRS in order to demonstrate clinically significant reduction in ankle pain. Baseline: 11/11/23: 5/10 NPS Goal status: INITIAL  3. Patient will be able to safely navigate a flight of 6 stairs, using proper foot placement, without handrail support, and without requiring assistance from PT in order to demonstrate significant improvement in LE strength and safety. Baseline: 11/11/23: Step to pattern - BUE Rail  Goal status: INITIAL  4.  Pt will increase strength of L Ankle DF/PF by at least 1/2 MMT grade in order to demonstrate improvement in strength and function. Baseline: 11/11/23: TBD  Goal status: INITIAL  5.  Pt will increase AROM of L Ankle PF by at least 10 deg pain free in order to demonstrate improvement in pain and ankle mobility for normalized gait pattern. Baseline: 11/11/23: 10 deg Goal status: INITIAL  6. Pt will increase by at least 0.13 m/s with LRAD in order to demonstrate clinically significant improvement in community ambulation.  Baseline: 11/11/23: .62 m/s, CAM Boot  Goal status: INITIAL  PLAN: PT FREQUENCY: 1-2x/week  PT DURATION: 12 weeks  PLANNED INTERVENTIONS: Therapeutic exercises, Therapeutic activity, Neuromuscular re-education, Balance training, Gait training, Patient/Family education, Self Care, Joint mobilization, Joint manipulation, Vestibular training, Canalith repositioning, Orthotic/Fit training, DME instructions, Dry Needling,  Electrical stimulation, Spinal manipulation, Spinal mobilization, Cryotherapy, Moist heat, Taping, Traction, Ultrasound, Ionotophoresis 4mg /ml Dexamethasone , Manual therapy, and Re-evaluation.  PLAN FOR NEXT SESSION: Review HEP; Initiate ankle mobility, Weight Bearing Progression, Ankle Strengthening, Knee/hip Strengthening  Maryanne Finder, PT, DPT Physical Therapist - [facility]  [facility] 12/31/2023, 1:45 PM

## 2024-01-01 NOTE — Telephone Encounter (Signed)
 pt lft mess if notes sent. I cld and lft mess on her vmail that notes and letter were faxed 12/23/23

## 2024-01-01 NOTE — Therapy (Signed)
 OUTPATIENT PHYSICAL THERAPY ANKLE TREATMENT &  Physical Therapy Progress Note   Dates of reporting period  11/13/23   to   01/02/24   Patient Name: Maria Sandoval MRN: [id number] DOB:04-11-1980, 43 y.o., female Today's Date: 01/01/2024  END OF SESSION:    Past Medical History:  Diagnosis Date   Anxiety disorder    Past Surgical History:  Procedure Laterality Date   ANKLE SURGERY Right    Patient Active Problem List   Diagnosis Date Noted   Pure hypercholesterolemia 03/18/2023   Eczema 03/18/2023   Anxiety and depression 03/18/2023   Class 1 obesity due to excess calories with body mass index (BMI) of 32.0 to 32.9 in adult 03/18/2023    PCP: Maria Sandoval  REFERRING PROVIDER:  Silva Juliene SAUNDERS, Sandoval      REFERRING DIAG:  [phone] (ICD-10-CM) - Tenosynovitis of tibialis posterior tendon  M65.979 (ICD-10-CM) - Peroneal tenosynovitis  D06.307J (ICD-10-CM) - Rupture of plantar fascia of left foot, initial encounter    Rationale for Evaluation and Treatment: Rehabilitation  THERAPY DIAG: Pain in left foot  Stiffness of left foot, not elsewhere classified  Other abnormalities of gait and mobility  Muscle weakness (generalized)  Rupture of plantar fascia of left foot, initial encounter  ONSET DATE: 10/18/2023  FOLLOW-UP APPT SCHEDULED WITH REFERRING PROVIDER: Yes  SUBJECTIVE:                                                                                                                                                                                         SUBJECTIVE STATEMENT:    Patient reports to OPPT with a chief concern of L ankle pain following: LT EPF W PRP, LT TENOLYSIS POSTERIOR TIBIAL AND PERONEAL TENDONS W/TOPAZ (10/18/2023)  PERTINENT HISTORY:   Maria Sandoval is a 43 y.o. female was previously seen at this location for PT regarding plantar fascia rupture and is now presenting with L foot pain following L surgical procedure (See above) on  10/18/2023.  She reports that since the surgical date she has difficulty flexing her 4th and 5th digit. She still has difficulty with showering, walking and accepting full weight bearing on her R foot. Currently ambulating CAM Boot; once in a while she will use a knee scooter for ambulation. Relieving factors: Ice modalities, elevation   She denies numbness and tingling, nausea, fever and parasthesia.   Imaging:   PAIN:    Pain Intensity: Present: 3-4/10, Best: 0/10, Worst: 5/10 Pain location: Surgical Site Pain Quality: intermittent, dull, and aching  Radiating: No  Swelling: Yes  Focal Weakness: No Typical footwear:   PRECAUTIONS: Fall  WEIGHT BEARING RESTRICTIONS:  Per Dr. Silva:  Restart physical therapy twice per week for 6 to 8 weeks no restrictions on weightbearing focus on strengthening conditioning manual tissue manipulation.  May be out of boot for weightbearing as tolerated   FALLS: Has patient fallen in last 6 months? 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  Recent memory is intact.  Remote memory is intact.  Attention span and concentration are intact.  Expressive speech is intact.  Patient's fund of knowledge is within normal limits for educational level.    Gross Musculoskeletal Assessment Bulk: Normal Tone: Normal No trophic changes noted to foot/ankle. No ecchymosis, erythema, or edema noted. No gross ankle/foot deformity noted  GAIT: Distance walked: 62m Assistive device  utilized: Knee Scooter, [employer] Level of assistance: Modified independence Comments: No excessive pronation/supination noted during gait. No excessive shoe wear noted on medial or lateral surfaces  Posture:  AROM AROM (Normal range in degrees) AROM   Lumbar   Flexion (65)   Extension (30)   Right lateral flexion (25)   Left lateral flexion (25)   Right rotation (30)   Left rotation (30)       Hip Right Left  Flexion (125)    Extension (15)    Abduction (40)    Adduction     Internal Rotation (45)    External Rotation (45)        Knee    Flexion (135)    Extension (0)        Ankle    Dorsiflexion (20) 10 deg 7 deg  Plantarflexion (50) 20 deg 10 deg  Inversion (35)    Eversion (15)    (* = pain; Blank rows = not tested)   LE MMT: MMT (out of 5) Right  Left   Hip flexion 4- 4-  Hip extension    Hip abduction    Hip adduction    Hip internal rotation    Hip external rotation    Knee flexion 4- 4-  Knee extension 4 4  Ankle dorsiflexion 4+ Not Tested  Ankle plantarflexion 4+ Not Tested  Ankle inversion    Ankle eversion    (* = pain; Blank rows = not tested)  Sensation Grossly intact to light touch throughout bilateral LEs as determined by testing dermatomes L2-S2. Proprioception, stereognosis, and hot/cold testing deferred on this date.  Reflexes R/L Knee Jerk (L3/4): 2+/2+  Ankle Jerk (S1/2): 2+/2+   Muscle Length Hamstrings: R: Not examined L: Not examined  Palpation Location LEFT  RIGHT           Calcaneus 0   Achilles Tendon 0   Flexor Digitorum Tendon  2   Plantar Fascia 2   Peroneal Tendon 2   (Blank rows = not tested) Graded on 0-4 scale (0 = no pain, 1 = pain, 2 = pain with wincing/grimacing/flinching, 3 = pain with withdrawal, 4 = unwilling to allow palpation), (Blank rows = not tested)  Passive Accessory Motion Deferred  SPECIAL TESTS  Achilles Integrity  Thompson Test: Negative   TODAY'S TREATMENT: DATE: 01/01/24    ***   Subjective:  Patient feel stressed about recent termination from her job.  Physical therapy treatment session today consisted of completing assessment of goals and administration of testing as demonstrated and documented in flow sheet, treatment, and goals section of this note. Addition treatments may be found below.   LEFS:  Worst pain:  Stair negotiation:  L ankle DF/PF strength:  L ankle PF AROM:  :   Therapeutic Activity:   Nustep level 3-1 x 5 minutes to encourage weightbearing through L foot and encourage ankle DF (average 15 spm)    Standing heel raise on 6 step 3 x 10 - double limb    Kettle bell squats 10# KB 3 x 10    Fwd lunge onto bosu ball 2 x 10 leading with each LE   Fwd step ups onto 6 step  x 10 each LE  PATIENT EDUCATION:  Education details: Exercise Technique, POC   Person educated: Patient Education method: Explanation and Handouts Education comprehension: verbalized understanding and returned demonstration   HOME EXERCISE PROGRAM:  Access Code: [email or web address] URL: [email or web address] Date: 11/13/2023 Prepared by: Lonni Pall  Exercises - Seated Ankle Circles  - 2 x daily - 7 x weekly - 2-3 sets - 15-20 reps - Seated Ankle Pumps  - 2 x daily - 7 x weekly - 2-3 sets - 12-15 reps - 5s hold - Towel Scrunches  - 1 x daily - 7 x weekly - 2-3 sets - 10-12 reps - Seated Figure 4 Ankle Inversion with Resistance  - 1 x daily - 7 x weekly - 2-3 sets - 10-12 reps - 5s hold - Seated Heel Raise  - 1 x daily - 7 x weekly - 2-3 sets - 10-12 reps - 5s hold - Seated Ankle Eversion with Resistance  - 1 x daily - 7 x weekly - 2-3 sets - 10-12 reps - 5s hold - Seated Long Arc Quad  - 1 x daily - 7 x weekly - 2-3 sets - 12-15 reps - 5s hold - Supine Active Straight Leg Raise  - 1 x daily - 7 x weekly - 2-3 sets - 10-12 reps - 3s hold - Sidelying Hip Abduction  - 1 x daily - 7 x weekly - 2-3 sets - 10-12 reps - 3s hold  Access Code: [email or web address] URL:  [email or web address] Date: 11/28/2023 Prepared by: Maryanne Finder  Exercises - Toe Spreading  - 2-3 x daily - 5-7 x weekly - 20 reps - Seated Toe Curl  - 2-3 x daily - 5-7 x weekly - 20 reps - Seated Toe Flexion Extension PROM  - 2-3 x daily - 5-7 x weekly - 10 reps - 5-10 second hold  Access Code: [email or web address] URL: [email or web address] Date: 12/09/2023 Prepared by: Maryanne Finder  Exercises - Long Sitting Ankle Eversion with Resistance  - 1 x daily - 3-4 x weekly - 3 sets - 10 reps - Long Sitting Ankle Inversion with Resistance  - 1 x daily - 3-4 x weekly - 3 sets - 10 reps - Long Sitting Ankle Plantar Flexion with Resistance  - 1 x daily - 3-4 x weekly - 3 sets - 10 reps - Long Sitting Ankle Dorsiflexion with Anchored Resistance  - 1 x daily - 3-4 x weekly - 3 sets - 10 reps  ASSESSMENT:  CLINICAL IMPRESSION:       Continues to complain of increased pain in L foot with  weightbearing. Session focused on ankle strengthening and negotiation over unlevel/unstable surfaces. Difficulty bearing full weight on L foot due to pain in the arch. Patient will benefit from skilled PT interventions in order to facilitate return to PLOF, decrease risk of falls and maximize safe discharge to work related tasks.   OBJECTIVE IMPAIRMENTS: Abnormal gait, decreased activity tolerance, decreased balance, decreased endurance, difficulty walking, decreased ROM, decreased strength, hypomobility, and pain.   ACTIVITY LIMITATIONS: standing, squatting, stairs, transfers, and bathing  PARTICIPATION LIMITATIONS: occupation  PERSONAL FACTORS: Past/current experiences, Time since onset of injury/illness/exacerbation, and 1-2 comorbidities: anxiety, s/p ankle sx are also affecting patient's functional outcome.   REHAB POTENTIAL: Good  CLINICAL DECISION MAKING: Evolving/moderate complexity  EVALUATION COMPLEXITY: Moderate   GOALS: Goals reviewed with patient? Yes  SHORT TERM GOALS:  Target date: 01/29/2024  Pt will be independent with HEP to improve strength and decrease ankle pain to improve pain-free function at home and work. Baseline: Initial HEP provided  Goal status: INITIAL   LONG TERM GOALS: Target date: 02/26/2024  Pt will increase LEFS by at least 9 points in order to demonstrate significant improvement in lower extremity function. Baseline: 11/11/23: 37 / 80 = 46.3 % Goal status: INITIAL  2.  Pt will decrease worst ankle pain by at least 3 points on the NPRS in order to demonstrate clinically significant reduction in ankle pain. Baseline: 11/11/23: 5/10 NPS Goal status: INITIAL  3. Patient will be able to safely navigate a flight of 6 stairs, using proper foot placement, without handrail support, and without requiring assistance from PT in order to demonstrate significant improvement in LE strength and safety. Baseline: 11/11/23: Step to pattern - BUE Rail  Goal status: INITIAL  4.  Pt will increase strength of L Ankle DF/PF by at least 1/2 MMT grade in order to demonstrate improvement in strength and function. Baseline: 11/11/23: TBD  Goal status: INITIAL  5.  Pt will increase AROM of L Ankle PF by at least 10 deg pain free in order to demonstrate improvement in pain and ankle mobility for normalized gait pattern. Baseline: 11/11/23: 10 deg Goal status: INITIAL  6. Pt will increase by at least 0.13 m/s with LRAD in order to demonstrate clinically significant improvement in community ambulation.  Baseline: 11/11/23: .62 m/s, CAM Boot  Goal status: INITIAL  PLAN: PT FREQUENCY: 1-2x/week  PT DURATION: 12 weeks  PLANNED INTERVENTIONS: Therapeutic exercises, Therapeutic activity, Neuromuscular re-education, Balance training, Gait training, Patient/Family education, Self Care, Joint mobilization, Joint manipulation, Vestibular training, Canalith repositioning, Orthotic/Fit training, DME instructions, Dry Needling, Electrical stimulation, Spinal  manipulation, Spinal mobilization, Cryotherapy, Moist heat, Taping, Traction, Ultrasound, Ionotophoresis 4mg /ml Dexamethasone , Manual therapy, and Re-evaluation.  PLAN FOR NEXT SESSION: Review HEP; Initiate ankle mobility, Weight Bearing Progression, Ankle Strengthening, Knee/hip Strengthening  Maryanne Finder, PT, DPT Physical Therapist - [place]  [facility] 01/01/2024, 10:00 AM

## 2024-01-02 ENCOUNTER — Ambulatory Visit

## 2024-01-02 DIAGNOSIS — M79672 Pain in left foot: Secondary | ICD-10-CM | POA: Diagnosis not present

## 2024-01-02 DIAGNOSIS — M25675 Stiffness of left foot, not elsewhere classified: Secondary | ICD-10-CM

## 2024-01-02 DIAGNOSIS — S93692A Other sprain of left foot, initial encounter: Secondary | ICD-10-CM

## 2024-01-02 DIAGNOSIS — R2689 Other abnormalities of gait and mobility: Secondary | ICD-10-CM

## 2024-01-02 DIAGNOSIS — M6281 Muscle weakness (generalized): Secondary | ICD-10-CM | POA: Diagnosis not present

## 2024-01-05 ENCOUNTER — Other Ambulatory Visit: Payer: Self-pay | Admitting: Podiatry

## 2024-01-06 ENCOUNTER — Ambulatory Visit

## 2024-01-06 NOTE — Progress Notes (Unsigned)
 PCP:  Antonette Angeline ORN, NP   No chief complaint on file.    HPI:      Ms. Maria Sandoval is a 43 y.o. G3P0030 whose LMP was Patient's last menstrual period was 12/19/2023 (exact date)., presents today for her annual examination.  Her menses are regular every 28-30 days, lasting 3-5 days, mod flow, no BTB, mild dysmen, improved with NSAIDs.  ???? VS SX, normal labs with PCP  Sex activity: not sexually active. No vag sx.  Last Pap: 10/15/22 Results were NILM/neg HPV DNA; no hx of abn paps  Last mammogram: 11/21/23 Results were Cat 3 due to stable calcifications RT breast, repeat dx mammo in 12 months There is no FH of breast cancer. There is no FH of ovarian cancer. The patient does not do self-breast exams.  Tobacco use: vapes daily Alcohol use: none No drug use.  Exercise: not active  She does get adequate calcium and some Vitamin D in her diet. Has upcoming PCP appt.  Patient Active Problem List   Diagnosis Date Noted   Pure hypercholesterolemia 03/18/2023   Eczema 03/18/2023   Anxiety and depression 03/18/2023   Class 1 obesity due to excess calories with body mass index (BMI) of 32.0 to 32.9 in adult 03/18/2023    Past Surgical History:  Procedure Laterality Date   ANKLE SURGERY Right     Family History  Problem Relation Age of Onset   Hypertension Mother    Diabetes Mother    Anxiety disorder Mother    Depression Mother    Healthy Father    Seizures Sister    Colon cancer Paternal Uncle        late years   Diabetes Maternal Grandmother    Hypertension Maternal Grandmother    Heart attack Maternal Grandfather    Other Paternal Grandmother        unknown medical history   Other Paternal Grandfather        unknown medical hsitory   Breast cancer Neg Hx     Social History   Socioeconomic History   Marital status: Single    Spouse name: Not on file   Number of children: Not on file   Years of education: Not on file   Highest education level: Not on  file  Occupational History   Not on file  Tobacco Use   Smoking status: Every Day    Current packs/day: 0.00    Types: Cigarettes, E-cigarettes    Last attempt to quit: 2019    Years since quitting: 6.8   Smokeless tobacco: Never   Tobacco comments:    I don't smoke anymore but I vape daily.  Vaping Use   Vaping status: Every Day   Substances: Nicotine , Flavoring  Substance and Sexual Activity   Alcohol use: Not Currently    Comment: last use ~ 2019   Drug use: Not Currently    Types: Cocaine, Marijuana    Comment: last use ~2021   Sexual activity: Not Currently    Partners: Male    Birth control/protection: None  Other Topics Concern   Not on file  Social History Narrative   Not on file   Social Drivers of Health   Financial Resource Strain: Not on file  Food Insecurity: Food Insecurity Present (06/20/2021)   Hunger Vital Sign    Worried About Running Out of Food in the Last Year: Sometimes true    Ran Out of Food in the Last Year: Sometimes true  Transportation Needs: No Transportation Needs (06/20/2021)   PRAPARE - Administrator, Civil Service (Medical): No    Lack of Transportation (Non-Medical): No  Physical Activity: Not on file  Stress: Not on file  Social Connections: Not on file  Intimate Partner Violence: Not on file     Current Outpatient Medications:    gabapentin  (NEURONTIN ) 300 MG capsule, Take 1 capsule (300 mg total) by mouth 3 (three) times daily., Disp: 270 capsule, Rfl: 0   methylPREDNISolone (MEDROL DOSEPAK) 4 MG TBPK tablet, 6 day dose pack - take as directed, Disp: 21 tablet, Rfl: 0   Multiple Vitamin (MULTIVITAMIN) tablet, Take 1 tablet by mouth daily., Disp: , Rfl:    OVER THE COUNTER MEDICATION, Sinkgo Biloba, Disp: , Rfl:    OVER THE COUNTER MEDICATION, Natural Fat Burner, Disp: , Rfl:    Probiotic Product (PROBIOTIC DAILY PO), Take 1 tablet by mouth daily., Disp: , Rfl:    traMADol  (ULTRAM ) 50 MG tablet, TAKE 1 TABLET BY MOUTH  EVERY 6 HOURS AS NEEDED, Disp: 10 tablet, Rfl: 0   triamcinolone  cream (KENALOG ) 0.1 %, Apply 1 application to the affected area(s) topically 2 (two) times daily., Disp: 30 g, Rfl: 1     ROS:  Review of Systems  Constitutional:  Negative for fatigue, fever and unexpected weight change.  Respiratory:  Negative for cough, shortness of breath and wheezing.   Cardiovascular:  Negative for chest pain, palpitations and leg swelling.  Gastrointestinal:  Negative for blood in stool, constipation, diarrhea, nausea and vomiting.  Endocrine: Negative for cold intolerance, heat intolerance and polyuria.  Genitourinary:  Negative for dyspareunia, dysuria, flank pain, frequency, genital sores, hematuria, menstrual problem, pelvic pain, urgency, vaginal bleeding, vaginal discharge and vaginal pain.  Musculoskeletal:  Negative for back pain, joint swelling and myalgias.  Skin:  Negative for rash.  Neurological:  Negative for dizziness, syncope, light-headedness, numbness and headaches.  Hematological:  Negative for adenopathy.  Psychiatric/Behavioral:  Negative for agitation, confusion, sleep disturbance and suicidal ideas. The patient is not nervous/anxious.    BREAST: No symptoms   Objective: LMP 12/19/2023 (Exact Date)    Physical Exam Constitutional:      Appearance: She is well-developed.  Genitourinary:     Vulva normal.     Right Labia: No rash, tenderness or lesions.    Left Labia: No tenderness, lesions or rash.    No vaginal discharge, erythema or tenderness.      Right Adnexa: not tender and no mass present.    Left Adnexa: not tender and no mass present.    No cervical friability or polyp.     Uterus is not enlarged or tender.  Breasts:    Right: No mass, nipple discharge, skin change or tenderness.     Left: No mass, nipple discharge, skin change or tenderness.  Neck:     Thyroid: No thyromegaly.  Cardiovascular:     Rate and Rhythm: Normal rate and regular rhythm.      Heart sounds: Normal heart sounds. No murmur heard. Pulmonary:     Effort: Pulmonary effort is normal.     Breath sounds: Normal breath sounds.  Abdominal:     Palpations: Abdomen is soft.     Tenderness: There is no abdominal tenderness. There is no guarding or rebound.  Musculoskeletal:        General: Normal range of motion.     Cervical back: Normal range of motion.  Lymphadenopathy:     Cervical: No  cervical adenopathy.  Neurological:     General: No focal deficit present.     Mental Status: She is alert and oriented to person, place, and time.     Cranial Nerves: No cranial nerve deficit.  Skin:    General: Skin is warm and dry.  Psychiatric:        Mood and Affect: Mood normal.        Behavior: Behavior normal.        Thought Content: Thought content normal.        Judgment: Judgment normal.  Vitals reviewed.    Assessment/Plan: Encounter for annual routine gynecological examination  Cervical cancer screening - Plan: Cytology - PAP  Screening for HPV (human papillomavirus) - Plan: Cytology - PAP  Encounter for screening mammogram for malignant neoplasm of breast - Plan: MM 3D SCREENING MAMMOGRAM BILATERAL BREAST; pt to schedule mammo            GYN counsel breast self exam, mammography screening, adequate intake of calcium and vitamin D, diet and exercise     F/U  No follow-ups on file.  Saleemah Mollenhauer B. Saudia Smyser, PA-C 01/06/2024 4:20 PM

## 2024-01-07 ENCOUNTER — Ambulatory Visit (INDEPENDENT_AMBULATORY_CARE_PROVIDER_SITE_OTHER): Admitting: Obstetrics and Gynecology

## 2024-01-07 ENCOUNTER — Encounter: Payer: Self-pay | Admitting: Obstetrics and Gynecology

## 2024-01-07 VITALS — BP 117/77 | HR 68 | Ht 67.0 in | Wt 201.0 lb

## 2024-01-07 DIAGNOSIS — Z1329 Encounter for screening for other suspected endocrine disorder: Secondary | ICD-10-CM

## 2024-01-07 DIAGNOSIS — Z01419 Encounter for gynecological examination (general) (routine) without abnormal findings: Secondary | ICD-10-CM

## 2024-01-07 DIAGNOSIS — N951 Menopausal and female climacteric states: Secondary | ICD-10-CM

## 2024-01-07 DIAGNOSIS — Z1231 Encounter for screening mammogram for malignant neoplasm of breast: Secondary | ICD-10-CM

## 2024-01-07 DIAGNOSIS — R928 Other abnormal and inconclusive findings on diagnostic imaging of breast: Secondary | ICD-10-CM | POA: Diagnosis not present

## 2024-01-07 NOTE — Patient Instructions (Signed)
 I value your feedback and you entrusting [place] with your care. If you get a [place] patient survey, I would appreciate you taking the time to let us know about your experience today. Thank you! ? ? ?

## 2024-01-08 ENCOUNTER — Ambulatory Visit

## 2024-01-08 ENCOUNTER — Ambulatory Visit: Payer: Self-pay | Admitting: Obstetrics and Gynecology

## 2024-01-08 ENCOUNTER — Ambulatory Visit (INDEPENDENT_AMBULATORY_CARE_PROVIDER_SITE_OTHER): Admitting: Podiatry

## 2024-01-08 VITALS — Ht 67.0 in | Wt 201.0 lb

## 2024-01-08 DIAGNOSIS — S93692A Other sprain of left foot, initial encounter: Secondary | ICD-10-CM

## 2024-01-08 DIAGNOSIS — M65979 Unspecified synovitis and tenosynovitis, unspecified ankle and foot: Secondary | ICD-10-CM

## 2024-01-08 DIAGNOSIS — M65969 Unspecified synovitis and tenosynovitis, unspecified lower leg: Secondary | ICD-10-CM | POA: Diagnosis not present

## 2024-01-08 LAB — TSH+FREE T4
Free T4: 1.13 ng/dL (ref 0.82–1.77)
TSH: 0.772 u[IU]/mL (ref 0.450–4.500)

## 2024-01-09 NOTE — Progress Notes (Signed)
  Subjective:  Patient ID: Maria Sandoval, female    DOB: 1981-02-18,  MRN: [id number]  Chief Complaint  Patient presents with   Post-op Follow-up    RM 1 Per Dr. Silva POV #4 xrays DOS 10/18/23 LT EPF W/ PRP, LT TENOLYSIS POSTERIOR TIBAL AND PERONEAL TENDONS W/TOPAZ. Pt states pain on the ball of foot after falling, xrays- taken on 12/18/23.     43 y.o. female returns for post-op check.  Had a recent fall and had to go to the ER, she fell off a vine.  Review of Systems: Negative except as noted in the HPI. Denies N/V/F/Ch.   Objective:  There were no vitals filed for this visit. Body mass index is 31.48 kg/m. Constitutional Well developed. Well nourished.  Vascular Foot warm and well perfused. Capillary refill normal to all digits.  Calf is soft and supple, no posterior calf or knee pain, negative Homans' sign  Neurologic Normal speech. Oriented to person, place, and time. Epicritic sensation to light touch grossly present bilaterally.  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 Tenosynovitis of tibialis posterior tendon   2. Rupture of plantar fascia of left foot, initial encounter   3. Peroneal tenosynovitis     Plan:  Patient was evaluated and treated and all questions answered.  S/p foot surgery left - Steroid taper has resolved most of her pain.  She will begin her home physical therapy plan.  She is able to be on her feet for about 5 to 6 hours at this time.  She will let me know if she is ready to return to work when she is able.  Follow-up in 2 months to resolve.  Return in about 2 months (around 03/09/2024) for f/u from surgery 8/1.

## 2024-01-10 ENCOUNTER — Encounter: Payer: Self-pay | Admitting: Podiatry

## 2024-01-14 ENCOUNTER — Other Ambulatory Visit: Payer: Self-pay | Admitting: Podiatry

## 2024-01-16 ENCOUNTER — Encounter: Payer: Self-pay | Admitting: Podiatry

## 2024-01-16 DIAGNOSIS — S93622A Sprain of tarsometatarsal ligament of left foot, initial encounter: Secondary | ICD-10-CM

## 2024-01-21 ENCOUNTER — Telehealth: Payer: Self-pay

## 2024-01-21 NOTE — Telephone Encounter (Signed)
 Received faxed refill request from [facility] pharmacy for Tramadol .

## 2024-01-25 ENCOUNTER — Ambulatory Visit
Admission: RE | Admit: 2024-01-25 | Discharge: 2024-01-25 | Disposition: A | Source: Ambulatory Visit | Attending: Podiatry

## 2024-01-25 DIAGNOSIS — S93622A Sprain of tarsometatarsal ligament of left foot, initial encounter: Secondary | ICD-10-CM

## 2024-01-26 ENCOUNTER — Other Ambulatory Visit: Payer: Self-pay | Admitting: Podiatry

## 2024-01-27 ENCOUNTER — Telehealth: Payer: Self-pay | Admitting: Podiatry

## 2024-01-27 MED ORDER — TRAMADOL HCL 50 MG PO TABS: 50.0000 mg | ORAL_TABLET | Freq: Four times a day (QID) | ORAL | 0 refills | Status: DC | PRN

## 2024-01-27 NOTE — Telephone Encounter (Signed)
 Patient called regarding her prescription refill for tramadol . She would like to speak with someone in regards to this. Her telephone number is [phone]. She would also like to know if she should schedule an appointment to discuss her MRI results.

## 2024-01-28 ENCOUNTER — Encounter: Payer: Self-pay | Admitting: Podiatry

## 2024-01-28 ENCOUNTER — Ambulatory Visit: Payer: Self-pay | Admitting: Podiatry

## 2024-01-28 NOTE — Telephone Encounter (Signed)
 cld pt bk and she is upset that Melia Clore told her they sent req for office visit notes from 01/08/24. I adv her several times I have not req. I adv her what I faxed on 01/10/24-letter of her being out till 03/16/24. I gave her fax# for me to have them send over to me again. She got very upset and rude yelling about us  not sending req to new case person for [facility]. I adv her to calm down or call back when she was calm. I adv her I would call her once I get request and understand that there are pt's in front of her, so it will not be done the same day I recd. I adv also if they send a form to fill out she will have to pay 25.00 for it to be completed, but if they wanted just notes, I can just fax back to them.I adv her that is her resp to get with [facility] and give my info for it to be sent.

## 2024-01-28 NOTE — Telephone Encounter (Signed)
 s/w pt and adv forms were recd via fax and will not charge fee for these. Any forms going fwd she will have to pay 25. I will email her what I fax back to Kevin -Maria Sandoval so she will have for her recrds

## 2024-01-28 NOTE — Telephone Encounter (Signed)
 I went ahead and faxed the 01/08/24 notes to [facility] [phone]

## 2024-01-28 NOTE — Telephone Encounter (Signed)
 Including MRI results per Dr. Silva [phone]

## 2024-02-04 ENCOUNTER — Other Ambulatory Visit: Payer: Self-pay | Admitting: Podiatry

## 2024-02-07 ENCOUNTER — Encounter: Payer: Self-pay | Admitting: Internal Medicine

## 2024-02-14 ENCOUNTER — Other Ambulatory Visit: Payer: Self-pay | Admitting: Podiatry

## 2024-02-17 ENCOUNTER — Encounter: Payer: Self-pay | Admitting: Internal Medicine

## 2024-02-17 ENCOUNTER — Ambulatory Visit: Admitting: Internal Medicine

## 2024-02-17 VITALS — BP 118/74 | Ht 67.0 in | Wt 198.8 lb

## 2024-02-17 DIAGNOSIS — G8929 Other chronic pain: Secondary | ICD-10-CM

## 2024-02-17 DIAGNOSIS — M722 Plantar fascial fibromatosis: Secondary | ICD-10-CM

## 2024-02-17 DIAGNOSIS — M84375G Stress fracture, left foot, subsequent encounter for fracture with delayed healing: Secondary | ICD-10-CM

## 2024-02-17 DIAGNOSIS — M25572 Pain in left ankle and joints of left foot: Secondary | ICD-10-CM

## 2024-02-17 DIAGNOSIS — M25571 Pain in right ankle and joints of right foot: Secondary | ICD-10-CM | POA: Diagnosis not present

## 2024-02-17 NOTE — Patient Instructions (Signed)
 Foot Pain Many things can cause foot pain. Common causes include injuries to the foot. The injuries include sprains or broken bones, or injuries that affect the nerves in the feet. Other causes of foot pain include arthritis, blisters, and bunions. To know what causes your foot pain, your health care provider will take a detailed history of your symptoms. They will also do a physical exam as well as imaging tests, such as X-ray or MRI. Follow these instructions at home: Managing pain, stiffness, and swelling  If told, put ice on the painful area. Put ice in a plastic bag. Place a towel between your skin and the bag. Leave the ice on for 20 minutes, 2-3 times a day. If your skin turns bright red, remove the ice right away to prevent skin damage. The risk of damage is higher if you cannot feel pain, heat, or cold. Activity Do not stand or walk for long periods. Do stretches to relieve foot pain and stiffness as told by your provider. Do not lift anything that is heavier than 10 lb (4.5 kg), or the limit that you are told, until your provider says that it is safe. Lifting a lot of weight can put added pressure on your feet. Return to your normal activities as told by your provider. Ask your provider what activities are safe for you. Lifestyle Wear comfortable, supportive shoes that fit you well. Do not wear high heels. Keep your feet clean and dry. General instructions Take over-the-counter and prescription medicines only as told by your provider. Rub your foot gently. Pay attention to any changes in your symptoms. Let your provider know if symptoms become worse. Keep all follow-up visits. Your provider will want to monitor your progress. Contact a health care provider if: Your pain does not get better after a few days of treatment at home. Your pain gets worse. You cannot stand on your foot. Your foot or toes are swollen. Your foot is numb or tingling. Get help right away if: Your foot  or toes turn white or blue. You have warmth and redness along your foot. This information is not intended to replace advice given to you by your health care provider. Make sure you discuss any questions you have with your health care provider. Document Revised: 03/29/2022 Document Reviewed: 12/05/2021 Elsevier Patient Education  2024 [employer].
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
## 2024-02-24 ENCOUNTER — Other Ambulatory Visit: Payer: Self-pay | Admitting: Podiatry

## 2024-03-04 ENCOUNTER — Other Ambulatory Visit: Payer: Self-pay | Admitting: Podiatry

## 2024-03-09 ENCOUNTER — Ambulatory Visit: Admitting: Podiatry

## 2024-03-09 VITALS — Ht 67.0 in | Wt 198.8 lb

## 2024-03-09 DIAGNOSIS — S93692D Other sprain of left foot, subsequent encounter: Secondary | ICD-10-CM

## 2024-03-10 NOTE — Progress Notes (Signed)
"  °  Subjective:  Patient ID: Maria Sandoval, female    DOB: 1981-01-25,  MRN: [id number]  Chief Complaint  Patient presents with   Post-op Follow-up    Rm 6 Patient is here for f/u post surgery. Pt states pain in bottom of left foot with numbing sensation in left 1st and 2nd toes.     43 y.o. female returns for post-op check.  Overall doing fairly well does not have significant pain most days, able to do most of her activities but has occasional twinges of pain.  Dealing with some new numbness in the 1st and 2nd toes.  Review of Systems: Negative except as noted in the HPI. Denies N/V/F/Ch.   Objective:  There were no vitals filed for this visit. Body mass index is 31.14 kg/m. Constitutional Well developed. Well nourished.  Vascular Foot warm and well perfused. Capillary refill normal to all digits.  Calf is soft and supple, no posterior calf or knee pain, negative Homans' sign  Neurologic Normal speech. Oriented to person, place, and time. Epicritic sensation to light touch grossly present bilaterally.  Some decrease sensation in first interspace  Dermatologic Skin is healed well without hypertrophy or tenderness  Orthopedic: No pain in the heel.  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 Rupture of plantar fascia of left foot, subsequent encounter     Plan:  Patient was evaluated and treated and all questions answered.  S/p foot surgery left Overall doing fairly well and is healed from her injuries and surgery at this point.  Activity and shoe gear as tolerated.  Has no further restrictions and can work as tolerated.  Unclear what the numbness she is dealing with is from, discussed could be neuropraxia that should resolve in the next 3 to 5 weeks, if not improving by mid January notify me for neurology referral for neurodiagnostic testing.  No follow-ups on  file.  "

## 2024-03-23 ENCOUNTER — Telehealth: Payer: Self-pay

## 2024-03-23 ENCOUNTER — Other Ambulatory Visit (HOSPITAL_COMMUNITY): Payer: Self-pay

## 2024-03-23 ENCOUNTER — Encounter: Payer: Self-pay | Admitting: Internal Medicine

## 2024-03-23 ENCOUNTER — Ambulatory Visit: Admitting: Internal Medicine

## 2024-03-23 ENCOUNTER — Ambulatory Visit
Admission: RE | Admit: 2024-03-23 | Discharge: 2024-03-23 | Disposition: A | Discharge status: Home / Self Care | Source: Ambulatory Visit | Attending: Internal Medicine | Admitting: Internal Medicine

## 2024-03-23 VITALS — BP 124/74 | Ht 67.0 in | Wt 198.8 lb

## 2024-03-23 DIAGNOSIS — L2082 Flexural eczema: Secondary | ICD-10-CM

## 2024-03-23 DIAGNOSIS — F411 Generalized anxiety disorder: Secondary | ICD-10-CM | POA: Diagnosis not present

## 2024-03-23 DIAGNOSIS — M5442 Lumbago with sciatica, left side: Secondary | ICD-10-CM | POA: Diagnosis not present

## 2024-03-23 DIAGNOSIS — M722 Plantar fascial fibromatosis: Secondary | ICD-10-CM

## 2024-03-23 DIAGNOSIS — G8929 Other chronic pain: Secondary | ICD-10-CM | POA: Insufficient documentation

## 2024-03-23 DIAGNOSIS — F3341 Major depressive disorder, recurrent, in partial remission: Secondary | ICD-10-CM | POA: Diagnosis not present

## 2024-03-23 DIAGNOSIS — E7800 Pure hypercholesterolemia, unspecified: Secondary | ICD-10-CM

## 2024-03-23 MED ORDER — TRAMADOL HCL 50 MG PO TABS: 50.0000 mg | ORAL_TABLET | Freq: Every day | ORAL | 0 refills | Status: DC | PRN

## 2024-03-23 NOTE — Assessment & Plan Note (Signed)
 Complicated by obesity Will check lipid profile at annual exam Encouraged low-fat diet

## 2024-03-23 NOTE — Telephone Encounter (Signed)
 Pharmacy Patient Advocate Encounter   Received notification from Onbase that prior authorization for Tramadol  Hcl 50 tabs is required/requested.   Insurance verification completed.   The patient is insured through [facility] MEDICAID.   Per test claim: PA required; PA submitted to above mentioned insurance via Latent Key/confirmation #/[id number] [facility] Status is pending
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
## 2024-03-23 NOTE — Assessment & Plan Note (Signed)
 Not medicated She will continue to meet with her therapist Support offered

## 2024-03-23 NOTE — Assessment & Plan Note (Signed)
 Complicated by obesity Encouraged regular stretching and core strengthening X-ray lumbar spine today Rx for tramadol  50 mg daily as needed, has been referred to pain management, awaiting ointment

## 2024-03-23 NOTE — Patient Instructions (Signed)

## 2024-03-23 NOTE — Telephone Encounter (Signed)
 Pharmacy Patient Advocate Encounter  Received notification from [facility] MEDICAID that Prior Authorization for Tramadol  50mg  has been APPROVED from now to 09/19/24. Ran test claim, Copay is $4. This test claim was processed through [facility]- copay amounts may vary at other pharmacies due to pharmacy/plan contracts, or as the patient moves through the different stages of their insurance plan.   PA #/Case ID/Reference #: [id number]

## 2024-03-23 NOTE — Assessment & Plan Note (Signed)
 Continue triamcinolone  0.1% twice daily as needed

## 2024-03-23 NOTE — Assessment & Plan Note (Signed)
 Status post surgical intervention without improvement Rx for tramadol  50 mg daily as needed, has been referred to pain management-awaiting appointment She has been released by podiatry, advised her to consider a second opinion

## 2024-03-26 ENCOUNTER — Encounter: Payer: Self-pay | Admitting: Podiatry

## 2024-03-31 ENCOUNTER — Encounter: Payer: Self-pay | Admitting: Internal Medicine

## 2024-03-31 ENCOUNTER — Telehealth: Payer: Self-pay

## 2024-03-31 ENCOUNTER — Telehealth: Payer: Self-pay | Admitting: Podiatry

## 2024-03-31 DIAGNOSIS — M722 Plantar fascial fibromatosis: Secondary | ICD-10-CM

## 2024-03-31 DIAGNOSIS — G5792 Unspecified mononeuropathy of left lower limb: Secondary | ICD-10-CM

## 2024-03-31 DIAGNOSIS — S93692D Other sprain of left foot, subsequent encounter: Secondary | ICD-10-CM

## 2024-03-31 NOTE — Telephone Encounter (Signed)
 Copied from CRM [id number]. Topic: Clinical - Medical Advice >> Mar 31, 2024  1:58 PM Terri MATSU wrote: Reason for CRM: Patient is requesting a 2nd opinion for podiatrist [phone] foot and ankle clinic. [facility].

## 2024-03-31 NOTE — Telephone Encounter (Signed)
 Patient called asking for a referral for neurology or whoever Dr Silva  was going to  send her too because she is still having issues.

## 2024-04-01 ENCOUNTER — Ambulatory Visit: Payer: Self-pay | Admitting: Internal Medicine

## 2024-04-01 DIAGNOSIS — R222 Localized swelling, mass and lump, trunk: Secondary | ICD-10-CM

## 2024-04-01 NOTE — Addendum Note (Signed)
 Addended by: ANTONETTE ANGELINE ORN on: 04/01/2024 08:45 AM   Modules accepted: Orders

## 2024-04-01 NOTE — Telephone Encounter (Signed)
 Referral has been placed.  I would advise her to reach out to [facility] in 2 weeks to see if referral has been received so that she can schedule her appointment.

## 2024-04-01 NOTE — Telephone Encounter (Signed)
 Attempted to reach patient. No answer and unable to leave a message. Okay to advise below if patient calls back.

## 2024-04-06 ENCOUNTER — Other Ambulatory Visit: Payer: Self-pay | Admitting: Internal Medicine

## 2024-04-06 ENCOUNTER — Ambulatory Visit
Admission: RE | Admit: 2024-04-06 | Discharge: 2024-04-06 | Disposition: A | Discharge status: Home / Self Care | Source: Ambulatory Visit | Attending: Internal Medicine | Admitting: Internal Medicine

## 2024-04-06 DIAGNOSIS — R222 Localized swelling, mass and lump, trunk: Secondary | ICD-10-CM | POA: Insufficient documentation

## 2024-04-07 NOTE — Addendum Note (Signed)
 Addended byBETHA MEDICINE, Randye Treichler R on: 04/07/2024 12:51 PM   Modules accepted: Orders

## 2024-04-08 ENCOUNTER — Telehealth: Payer: Self-pay | Admitting: Podiatry

## 2024-04-08 ENCOUNTER — Ambulatory Visit: Payer: Self-pay | Admitting: Internal Medicine

## 2024-04-08 NOTE — Telephone Encounter (Signed)
 Sent mess to Dr Silva to adv of forms recd and if he was taking her out of work or restriction and for how long.

## 2024-04-09 ENCOUNTER — Other Ambulatory Visit: Payer: Self-pay | Admitting: Internal Medicine

## 2024-04-09 NOTE — Telephone Encounter (Signed)
 Faxed [employer] revised forms of pt being out of work for 3months approx per D.r McDonald  [phone] faxed Franky SAILOR See last office notes of [facility] referral

## 2024-04-10 NOTE — Telephone Encounter (Signed)
 Requested medication (s) are due for refill today - yes  Requested medication (s) are on the active medication list -yes  Future visit scheduled -yes  Last refill: 03/23/24 #30  Notes to clinic: non delegated Rx  Requested Prescriptions  Pending Prescriptions Disp Refills   traMADol  (ULTRAM ) 50 MG tablet [Pharmacy Med Name: traMADol  HCl 50 MG Oral Tablet] 30 tablet 0    Sig: TAKE 1 TABLET BY MOUTH ONCE DAILY AS NEEDED     Not Delegated - Analgesics:  Opioid Agonists Failed - 04/10/2024 10:43 AM      Failed - This refill cannot be delegated      Failed - Urine Drug Screen completed in last 360 days      Passed - Valid encounter within last 3 months    Recent Outpatient Visits           2 weeks ago Chronic midline low back pain with left-sided sciatica   [place] [facility] [place], Angeline ORN, NP   1 month ago Stress reaction of left foot with delayed healing, subsequent encounter   [place] [facility] [place], Angeline ORN, NP   3 months ago Chronic ankle pain, bilateral   [place] [facility] [place], Angeline ORN, NP   6 months ago Encounter for general adult medical examination with abnormal findings   [place] [facility] [place], Angeline ORN, NP                 Requested Prescriptions  Pending Prescriptions Disp Refills   traMADol  (ULTRAM ) 50 MG tablet [Pharmacy Med Name: traMADol  HCl 50 MG Oral Tablet] 30 tablet 0    Sig: TAKE 1 TABLET BY MOUTH ONCE DAILY AS NEEDED     Not Delegated - Analgesics:  Opioid Agonists Failed - 04/10/2024 10:43 AM      Failed - This refill cannot be delegated      Failed - Urine Drug Screen completed in last 360 days      Passed - Valid encounter within last 3 months    Recent Outpatient Visits           2 weeks ago Chronic midline low back pain with left-sided sciatica   [place] [facility] [place], Angeline ORN, NP   1 month ago Stress reaction of left  foot with delayed healing, subsequent encounter   [place] [facility] [place], Angeline ORN, NP   3 months ago Chronic ankle pain, bilateral   [place] [facility] [place], Angeline ORN, NP   6 months ago Encounter for general adult medical examination with abnormal findings   [place] [facility] [place], Angeline ORN, NP

## 2024-04-16 ENCOUNTER — Telehealth: Payer: Self-pay | Admitting: Podiatry

## 2024-04-16 NOTE — Telephone Encounter (Signed)
 Emailed the forms/notes that were faxed to Maria Sandoval 04/09/24

## 2024-04-21 ENCOUNTER — Other Ambulatory Visit: Payer: Self-pay | Admitting: Internal Medicine

## 2024-04-23 NOTE — Telephone Encounter (Signed)
 Requested medication (s) are due for refill today: yes  Requested medication (s) are on the active medication list: yes  Last refill:  03/23/24  Future visit scheduled: yes  Notes to clinic:  Unable to refill per protocol, cannot delegate.      Requested Prescriptions  Pending Prescriptions Disp Refills   traMADol  (ULTRAM ) 50 MG tablet [Pharmacy Med Name: traMADol  HCl 50 MG Oral Tablet] 30 tablet 0    Sig: TAKE 1 TABLET BY MOUTH ONCE DAILY AS NEEDED     Not Delegated - Analgesics:  Opioid Agonists Failed - 04/23/2024  8:51 AM      Failed - This refill cannot be delegated      Failed - Urine Drug Screen completed in last 360 days      Passed - Valid encounter within last 3 months    Recent Outpatient Visits           1 month ago Chronic midline low back pain with left-sided sciatica   [place] [facility] [place], Angeline ORN, NP   2 months ago Stress reaction of left foot with delayed healing, subsequent encounter   [place] [facility] [place], Angeline ORN, NP   4 months ago Chronic ankle pain, bilateral   [place] [facility] [place], Angeline ORN, NP   7 months ago Encounter for general adult medical examination with abnormal findings   [place] [facility] [place], Angeline ORN, NP

## 2024-05-28 ENCOUNTER — Ambulatory Visit: Admitting: Student in an Organized Health Care Education/Training Program

## 2024-09-17 ENCOUNTER — Encounter: Admitting: Internal Medicine
